# Patient Record
Sex: Female | Born: 1980 | Race: Black or African American | Hispanic: No | Marital: Single | Smoking: Current every day smoker
Health system: Southern US, Community
[De-identification: ages and names within clinical notes are randomized; demographics above are authoritative.]

## PROBLEM LIST (undated history)

## (undated) DIAGNOSIS — I10 Essential (primary) hypertension: Secondary | ICD-10-CM

## (undated) DIAGNOSIS — J449 Chronic obstructive pulmonary disease, unspecified: Secondary | ICD-10-CM

## (undated) DIAGNOSIS — J45909 Unspecified asthma, uncomplicated: Secondary | ICD-10-CM

## (undated) HISTORY — PX: OTHER SURGICAL HISTORY: SHX169

---
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
## 2001-10-29 ENCOUNTER — Emergency Department (HOSPITAL_COMMUNITY): Admission: EM | Admit: 2001-10-29 | Discharge: 2001-10-29 | Payer: Self-pay | Admitting: Emergency Medicine

## 2001-10-29 ENCOUNTER — Encounter: Payer: Self-pay | Admitting: Emergency Medicine

## 2006-06-12 ENCOUNTER — Emergency Department: Payer: Self-pay | Admitting: Emergency Medicine

## 2006-11-01 ENCOUNTER — Emergency Department: Payer: Self-pay | Admitting: Emergency Medicine

## 2011-03-11 ENCOUNTER — Emergency Department: Payer: Self-pay | Admitting: Emergency Medicine

## 2011-08-08 ENCOUNTER — Emergency Department: Payer: Self-pay | Admitting: Emergency Medicine

## 2012-02-22 LAB — HM PAP SMEAR: HM Pap smear: NEGATIVE

## 2012-12-08 ENCOUNTER — Emergency Department: Payer: Self-pay | Admitting: Emergency Medicine

## 2013-08-04 DIAGNOSIS — E669 Obesity, unspecified: Secondary | ICD-10-CM | POA: Insufficient documentation

## 2014-06-10 ENCOUNTER — Emergency Department: Payer: Self-pay | Admitting: Emergency Medicine

## 2014-06-10 LAB — COMPREHENSIVE METABOLIC PANEL
Albumin: 3.6 g/dL (ref 3.4–5.0)
Alkaline Phosphatase: 78 U/L
Anion Gap: 4 — ABNORMAL LOW (ref 7–16)
BUN: 12 mg/dL (ref 7–18)
Bilirubin,Total: 0.2 mg/dL (ref 0.2–1.0)
Calcium, Total: 9 mg/dL (ref 8.5–10.1)
Chloride: 109 mmol/L — ABNORMAL HIGH (ref 98–107)
Co2: 26 mmol/L (ref 21–32)
Creatinine: 0.9 mg/dL (ref 0.60–1.30)
EGFR (African American): >60
EGFR (Non-African Amer.): >60
Glucose: 98 mg/dL (ref 65–99)
Osmolality: 277 (ref 275–301)
Potassium: 3.4 mmol/L — ABNORMAL LOW (ref 3.5–5.1)
SGOT(AST): 11 U/L — ABNORMAL LOW (ref 15–37)
SGPT (ALT): 14 U/L
Sodium: 139 mmol/L (ref 136–145)
Total Protein: 7.5 g/dL (ref 6.4–8.2)

## 2014-06-10 LAB — CBC
HCT: 36 % (ref 35.0–47.0)
HGB: 11.2 g/dL — ABNORMAL LOW (ref 12.0–16.0)
MCH: 26.6 pg (ref 26.0–34.0)
MCHC: 31.1 g/dL — ABNORMAL LOW (ref 32.0–36.0)
MCV: 86 fL (ref 80–100)
Platelet: 302 10*3/uL (ref 150–440)
RBC: 4.21 10*6/uL (ref 3.80–5.20)
RDW: 17.3 % — ABNORMAL HIGH (ref 11.5–14.5)
WBC: 9.6 10*3/uL (ref 3.6–11.0)

## 2014-06-10 LAB — CK TOTAL AND CKMB (NOT AT ARMC)
CK, Total: 55 U/L
CK-MB: <0.5 ng/mL — ABNORMAL LOW (ref 0.5–3.6)

## 2014-06-10 LAB — TROPONIN I: Troponin-I: <0.02 ng/mL

## 2014-06-11 LAB — URINALYSIS, COMPLETE
Bacteria: NONE SEEN
Bilirubin,UR: NEGATIVE
Blood: NEGATIVE
Glucose,UR: NEGATIVE mg/dL (ref 0–75)
Ketone: NEGATIVE
Leukocyte Esterase: NEGATIVE
Nitrite: NEGATIVE
Ph: 5 (ref 4.5–8.0)
Protein: 30
RBC,UR: 1 /HPF (ref 0–5)
Specific Gravity: 1.031 (ref 1.003–1.030)
Squamous Epithelial: 5
WBC UR: 2 /HPF (ref 0–5)

## 2015-05-02 ENCOUNTER — Ambulatory Visit
Admission: RE | Admit: 2015-05-02 | Discharge: 2015-05-02 | Disposition: A | Discharge status: Home / Self Care | Payer: Disability Insurance | Source: Ambulatory Visit | Attending: Pediatrics | Admitting: Pediatrics

## 2015-05-02 ENCOUNTER — Other Ambulatory Visit: Payer: Self-pay | Admitting: Pediatrics

## 2015-05-02 DIAGNOSIS — I159 Secondary hypertension, unspecified: Secondary | ICD-10-CM

## 2015-05-02 DIAGNOSIS — J449 Chronic obstructive pulmonary disease, unspecified: Secondary | ICD-10-CM | POA: Diagnosis present

## 2015-12-05 ENCOUNTER — Emergency Department
Admission: EM | Admit: 2015-12-05 | Discharge: 2015-12-05 | Disposition: A | Discharge status: Home / Self Care | Payer: Disability Insurance | Attending: Emergency Medicine | Admitting: Emergency Medicine

## 2015-12-05 ENCOUNTER — Encounter: Payer: Self-pay | Admitting: Emergency Medicine

## 2015-12-05 DIAGNOSIS — F17210 Nicotine dependence, cigarettes, uncomplicated: Secondary | ICD-10-CM | POA: Insufficient documentation

## 2015-12-05 DIAGNOSIS — R531 Weakness: Secondary | ICD-10-CM | POA: Insufficient documentation

## 2015-12-05 LAB — CBC
HCT: 40.3 % (ref 35.0–47.0)
HEMOGLOBIN: 13.1 g/dL (ref 12.0–16.0)
MCH: 27.3 pg (ref 26.0–34.0)
MCHC: 32.6 g/dL (ref 32.0–36.0)
MCV: 83.6 fL (ref 80.0–100.0)
Platelets: 307 10*3/uL (ref 150–440)
RBC: 4.81 MIL/uL (ref 3.80–5.20)
RDW: 16.2 % — ABNORMAL HIGH (ref 11.5–14.5)
WBC: 8.8 10*3/uL (ref 3.6–11.0)

## 2015-12-05 LAB — BASIC METABOLIC PANEL
ANION GAP: 7 (ref 5–15)
BUN: 8 mg/dL (ref 6–20)
CALCIUM: 8.7 mg/dL — AB (ref 8.9–10.3)
CO2: 21 mmol/L — AB (ref 22–32)
Chloride: 108 mmol/L (ref 101–111)
Creatinine, Ser: 1 mg/dL (ref 0.44–1.00)
Glucose, Bld: 115 mg/dL — ABNORMAL HIGH (ref 65–99)
Potassium: 3.3 mmol/L — ABNORMAL LOW (ref 3.5–5.1)
Sodium: 136 mmol/L (ref 135–145)

## 2015-12-05 NOTE — ED Notes (Signed)
Patient states she went and donated plasma today and patient states "I just feel weak".  States started feeling weak 10 minutes PTA.  States has not had anything to eat today.

## 2015-12-06 ENCOUNTER — Telehealth: Payer: Self-pay | Admitting: Emergency Medicine

## 2015-12-06 NOTE — ED Notes (Signed)
Called patient due to lwot to inquire about condition and follow up plans. Explained that lab work was done here and would advise her to go to her pcp, as she still is weak, but says she is feeling some better.

## 2017-04-13 ENCOUNTER — Emergency Department
Admission: EM | Admit: 2017-04-13 | Discharge: 2017-04-13 | Disposition: A | Discharge status: Home / Self Care | Payer: Disability Insurance | Attending: Emergency Medicine | Admitting: Emergency Medicine

## 2017-04-13 ENCOUNTER — Encounter: Payer: Self-pay | Admitting: Emergency Medicine

## 2017-04-13 DIAGNOSIS — I10 Essential (primary) hypertension: Secondary | ICD-10-CM | POA: Insufficient documentation

## 2017-04-13 DIAGNOSIS — F17210 Nicotine dependence, cigarettes, uncomplicated: Secondary | ICD-10-CM | POA: Insufficient documentation

## 2017-04-13 DIAGNOSIS — K0889 Other specified disorders of teeth and supporting structures: Secondary | ICD-10-CM

## 2017-04-13 DIAGNOSIS — K029 Dental caries, unspecified: Secondary | ICD-10-CM

## 2017-04-13 HISTORY — DX: Essential (primary) hypertension: I10

## 2017-04-13 MED ORDER — LIDOCAINE VISCOUS 2 % MT SOLN
15.0000 mL | Freq: Once | OROMUCOSAL | Status: AC
  Administered 2017-04-13: 15 mL via OROMUCOSAL
  Filled 2017-04-13: qty 15

## 2017-04-13 MED ORDER — LIDOCAINE VISCOUS 2 % MT SOLN: 20.0000 mL | OROMUCOSAL | 0 refills | Status: DC | PRN

## 2017-04-13 MED ORDER — TRAMADOL HCL 50 MG PO TABS
50.0000 mg | ORAL_TABLET | Freq: Once | ORAL | Status: AC
  Administered 2017-04-13: 50 mg via ORAL
  Filled 2017-04-13: qty 1

## 2017-04-13 MED ORDER — TRAMADOL HCL 50 MG PO TABS: 50.0000 mg | ORAL_TABLET | Freq: Four times a day (QID) | ORAL | 0 refills | Status: DC | PRN

## 2017-04-13 MED ORDER — KETOROLAC TROMETHAMINE 60 MG/2ML IM SOLN
60.0000 mg | Freq: Once | INTRAMUSCULAR | Status: AC
  Administered 2017-04-13: 60 mg via INTRAMUSCULAR
  Filled 2017-04-13: qty 2

## 2017-04-13 MED ORDER — CEPHALEXIN 500 MG PO CAPS: 500.0000 mg | ORAL_CAPSULE | Freq: Four times a day (QID) | ORAL | 0 refills | Status: AC

## 2017-04-13 MED ORDER — PENICILLIN V POTASSIUM 250 MG PO TABS
500.0000 mg | ORAL_TABLET | Freq: Four times a day (QID) | ORAL | Status: DC
  Administered 2017-04-13: 500 mg via ORAL
  Filled 2017-04-13: qty 2

## 2017-04-13 NOTE — Discharge Instructions (Signed)
Please ensure that she were cleaning her teeth appropriately. Please follow-up with a dentist to have your teeth further evaluated. Please return with any fevers or worsening condition.

## 2017-04-13 NOTE — ED Triage Notes (Signed)
Pt arrives ambulatory to triage with c/o right sided dental pain x 3 days. Pt states that "I just can't take it anymore". Pt is in NAD at this time in triage.

## 2017-04-13 NOTE — ED Notes (Signed)
D/w Dr. Zenda AlpersWebster, pt's high blood pressure. No new orders at this time. Pt states her BP is always this high, but does not take any medications for it.  Informed patient the risks of continued HTN and encouraged her to follow up with [facility] which is on her dc instructions. Pt and significant other verbalized understanding  Pt denies any sxs of HTN at this time. Pt ambulatory to lobby.

## 2017-04-13 NOTE — ED Provider Notes (Signed)
[facility] Emergency Department Provider Note   ____________________________________________   First MD Initiated Contact with Patient 04/13/17 [phone]     (approximate)  I have reviewed the triage vital signs and the nursing notes.   HISTORY  Chief Complaint Dental Pain    HPI Rhonda Delgado is a 36 y.o. female who comes into the hospital today with a toothache. She reports that she's had pain for the past 3 days. She states that she can't eat, or sleep. She reports that it stridor and her crazy. The patient states that the pain is on the right side and she cannot discern which tooth hurts. The patient states that she has been taking Tylenol, BC powders Orajel and Anbesol without relief. The patient reports that she does not have a dentist. She has had pain on and off before but typically she'll just take a BC powder and it gets better. The patient reports that she does have some bad teeth on the right. She denies any fever and thinks that her face may be swollen and is unsure. She is here today for evaluation.   Past Medical History:  Diagnosis Date  . Hypertension     There are no active problems to display for this patient.   History reviewed. No pertinent surgical history.  Prior to Admission medications   Medication Sig Start Date End Date Taking? Authorizing Provider  cephALEXin (KEFLEX) 500 MG capsule Take 1 capsule (500 mg total) by mouth 4 (four) times daily. 04/13/17 04/23/17  Rebecka Apley, MD  lidocaine (XYLOCAINE) 2 % solution Use as directed 20 mLs in the mouth or throat as needed for mouth pain. 04/13/17   Rebecka Apley, MD  traMADol (ULTRAM) 50 MG tablet Take 1 tablet (50 mg total) by mouth every 6 (six) hours as needed. 04/13/17   Rebecka Apley, MD    Allergies Patient has no known allergies.  No family history on file.  Social History Social History  Substance Use Topics  . Smoking status: Current Every Day Smoker      Packs/day: 1.50    Types: Cigarettes  . Smokeless tobacco: Never Used  . Alcohol use Yes     Comment: occasionally    Review of Systems  Constitutional: No fever/chills Eyes: No visual changes. ENT: Dental pain Cardiovascular: Denies chest pain. Respiratory: Denies shortness of breath. Gastrointestinal: No abdominal pain.  No nausea, no vomiting.  No diarrhea.  No constipation. Genitourinary: Negative for dysuria. Musculoskeletal: Negative for back pain. Skin: Negative for rash. Neurological: Negative for headaches, focal weakness or numbness.   ____________________________________________   PHYSICAL EXAM:  VITAL SIGNS: ED Triage Vitals  Enc Vitals Group     BP 04/13/17 0254 (!) 185/105     Pulse Rate 04/13/17 0254 85     Resp 04/13/17 0254 18     Temp 04/13/17 0254 98.8 F (37.1 C)     Temp Source 04/13/17 0254 Oral     SpO2 04/13/17 0254 99 %     Weight 04/13/17 0255 260 lb (117.9 kg)     Height 04/13/17 0255 5\' 6"  (1.676 m)     Head Circumference --      Peak Flow --      Pain Score --      Pain Loc --      Pain Edu? --      Excl. in GC? --     Constitutional: Alert and oriented. Well appearing and in mild  distress. Eyes: Conjunctivae are normal. PERRL. EOMI. Head: Atraumatic. Nose: No congestion/rhinnorhea. Mouth/Throat: Mucous membranes are moist.  Oropharynx non-erythematous. Poor dentition. The patient has multiple rotted teeth. The patient has tenderness to palpation of her mandibular premolar first and second molars. There is extensive decay noted. No abscess noted no significant swelling noted. Cardiovascular: Normal rate, regular rhythm. Grossly normal heart sounds.  Good peripheral circulation. Respiratory: Normal respiratory effort.  No retractions. Lungs CTAB. Gastrointestinal: Soft and nontender. No distention. Positive bowel sounds Musculoskeletal: No lower extremity tenderness nor edema.   Neurologic:  Normal speech and language.  Skin:   Skin is warm, dry and intact. Marland Kitchen. Psychiatric: Mood and affect are normal.   ____________________________________________   LABS (all labs ordered are listed, but only abnormal results are displayed)  Labs Reviewed - No data to display ____________________________________________  EKG  none ____________________________________________  RADIOLOGY  No results found.  ____________________________________________   PROCEDURES  Procedure(s) performed: None  Procedures  Critical Care performed: No  ____________________________________________   INITIAL IMPRESSION / ASSESSMENT AND PLAN / ED COURSE  Pertinent labs & imaging results that were available during my care of the patient were reviewed by me and considered in my medical decision making (see chart for details).  This is a [age] year old female who comes into the hospital today with dental pain. The patient reports it has been going on for 3 days and has not gotten any better. The patient does have some extensive dental decay. The patient also had some elevated blood pressure. She reports that she has been prescribed medicine but she just doesn't take it because she doesn't want to. I will give the patient some tramadol, viscous lidocaine, Toradol and penicillin. I explained to the patient that she needs to see a dentist as her dental decay will continue to cause her pain. The patient reports that she understands. I will discharge the patient home. She has no other concerns she will be discharged home.      ____________________________________________   FINAL CLINICAL IMPRESSION(S) / ED DIAGNOSES  Final diagnoses:  Dental caries  Pain, dental      NEW MEDICATIONS STARTED DURING THIS VISIT:  New Prescriptions   CEPHALEXIN (KEFLEX) 500 MG CAPSULE    Take 1 capsule (500 mg total) by mouth 4 (four) times daily.   LIDOCAINE (XYLOCAINE) 2 % SOLUTION    Use as directed 20 mLs in the mouth or throat as needed for mouth  pain.   TRAMADOL (ULTRAM) 50 MG TABLET    Take 1 tablet (50 mg total) by mouth every 6 (six) hours as needed.     Note:  This document was prepared using Dragon voice recognition software and may include unintentional dictation errors.    Rebecka ApleyWebster, Krystall Kruckenberg P, MD 04/13/17 [phone]

## 2017-06-26 ENCOUNTER — Encounter: Payer: Self-pay | Admitting: Emergency Medicine

## 2017-06-26 ENCOUNTER — Emergency Department
Admission: EM | Admit: 2017-06-26 | Discharge: 2017-06-26 | Disposition: A | Discharge status: Home / Self Care | Payer: Disability Insurance | Attending: Emergency Medicine | Admitting: Emergency Medicine

## 2017-06-26 DIAGNOSIS — R079 Chest pain, unspecified: Secondary | ICD-10-CM | POA: Insufficient documentation

## 2017-06-26 DIAGNOSIS — Z79899 Other long term (current) drug therapy: Secondary | ICD-10-CM | POA: Insufficient documentation

## 2017-06-26 DIAGNOSIS — I10 Essential (primary) hypertension: Secondary | ICD-10-CM | POA: Insufficient documentation

## 2017-06-26 DIAGNOSIS — F17210 Nicotine dependence, cigarettes, uncomplicated: Secondary | ICD-10-CM | POA: Insufficient documentation

## 2017-06-26 DIAGNOSIS — F329 Major depressive disorder, single episode, unspecified: Secondary | ICD-10-CM | POA: Insufficient documentation

## 2017-06-26 DIAGNOSIS — F32A Depression, unspecified: Secondary | ICD-10-CM

## 2017-06-26 LAB — BASIC METABOLIC PANEL
Anion gap: 6 (ref 5–15)
BUN: 8 mg/dL (ref 6–20)
CALCIUM: 9.3 mg/dL (ref 8.9–10.3)
CO2: 23 mmol/L (ref 22–32)
CREATININE: 0.81 mg/dL (ref 0.44–1.00)
Chloride: 108 mmol/L (ref 101–111)
GFR calc Af Amer: >60 mL/min (ref >60)
GLUCOSE: 103 mg/dL — AB (ref 65–99)
POTASSIUM: 3.6 mmol/L (ref 3.5–5.1)
Sodium: 137 mmol/L (ref 135–145)

## 2017-06-26 LAB — CBC
HEMATOCRIT: 35 % (ref 35.0–47.0)
Hemoglobin: 11.3 g/dL — ABNORMAL LOW (ref 12.0–16.0)
MCH: 26.1 pg (ref 26.0–34.0)
MCHC: 32.3 g/dL (ref 32.0–36.0)
MCV: 81 fL (ref 80.0–100.0)
PLATELETS: 258 10*3/uL (ref 150–440)
RBC: 4.33 MIL/uL (ref 3.80–5.20)
RDW: 17.2 % — AB (ref 11.5–14.5)
WBC: 7.1 10*3/uL (ref 3.6–11.0)

## 2017-06-26 LAB — TROPONIN I: Troponin I: <0.03 ng/mL (ref <0.03)

## 2017-06-26 MED ORDER — LISINOPRIL-HYDROCHLOROTHIAZIDE 10-12.5 MG PO TABS: 1.0000 | ORAL_TABLET | Freq: Every day | ORAL | 2 refills | Status: DC

## 2017-06-26 MED ORDER — CITALOPRAM HYDROBROMIDE 10 MG PO TABS: 10.0000 mg | ORAL_TABLET | Freq: Every day | ORAL | 2 refills | Status: DC

## 2017-06-26 NOTE — ED Notes (Signed)
SOC    CALLED 

## 2017-06-26 NOTE — ED Triage Notes (Signed)
Pt in via POV with complaints of intermittent blurred vision x approximately 3 months, pt also reports an episode of "jitters" today.  Pt with uncontrolled hypertension, BP 228/133 upon arrival, states, "Oh that is nothing."  Pt is aware of her hx of HTN and does not take her medication because, "I ran out."  This RN explained to pt the possible consequences of extreme HTN, pt continues to play a game on her phone as I speak, states, "I am aware."  Other vitals WDL.

## 2017-06-26 NOTE — ED Notes (Signed)
SOC in progress.  

## 2017-06-26 NOTE — ED Notes (Signed)
Pt verbalized understanding od psych and ED MD instructions as well as home care and follow up. Pt in NAD at this time. Pt ambulatory at time of discharge.

## 2017-06-26 NOTE — ED Notes (Addendum)
Pt becomes tearful in triage room as I am getting her EKG, asked patient if she would like to talk about what is bothering her, pt states, "No, I will be alright."

## 2017-06-26 NOTE — ED Provider Notes (Signed)
[facility] Emergency Department Provider Note  Time seen: 7:18 PM  I have reviewed the triage vital signs and the nursing notes.   HISTORY  Chief Complaint Blurred Vision and Hypertension    HPI Claretta KJERSTEN ORMISTON is a 36 y.o. female With a past medical history of hypertension off medications, who presents to the emergency department for intermittent blurred vision intermittent chest pain and high blood pressure. According to the patient she has been off her blood pressure medications for approximately one year that she does not have insurance and has not seen a doctor. Blood pressure upon arrival 228 which she states is fairly normal for her. Patient states over the past several months she's been having intermittent blurred vision and intermittent chest pain. Denies any currently. Denies any shortness of breath nausea or diaphoresis. patient tearful during exam, saying she just going through a lot and has been depressed.  Past Medical History:  Diagnosis Date  . Hypertension     There are no active problems to display for this patient.   No past surgical history on file.  Prior to Admission medications   Medication Sig Start Date End Date Taking? Authorizing Provider  lidocaine (XYLOCAINE) 2 % solution Use as directed 20 mLs in the mouth or throat as needed for mouth pain. 04/13/17   Rebecka Apley, MD  traMADol (ULTRAM) 50 MG tablet Take 1 tablet (50 mg total) by mouth every 6 (six) hours as needed. 04/13/17   Rebecka Apley, MD    No Known Allergies  No family history on file.  Social History Social History  Substance Use Topics  . Smoking status: Current Every Day Smoker    Packs/day: 1.50    Types: Cigarettes  . Smokeless tobacco: Never Used  . Alcohol use Yes     Comment: occasionally    Review of Systems Constitutional: Negative for fever. Cardiovascular: Negative for chest pain. Respiratory: Negative for shortness of  breath. Gastrointestinal: Negative for abdominal pain Musculoskeletal: Negative for back pain. Neurological: Negative for headache All other ROS negative  ____________________________________________   PHYSICAL EXAM:  VITAL SIGNS: ED Triage Vitals  Enc Vitals Group     BP 06/26/17 1733 (!) 228/133     Pulse Rate 06/26/17 1733 87     Resp 06/26/17 1733 16     Temp 06/26/17 1733 98.9 F (37.2 C)     Temp Source 06/26/17 1733 Oral     SpO2 06/26/17 1733 98 %     Weight 06/26/17 1734 260 lb (117.9 kg)     Height 06/26/17 1734  (1.676 m)     Head Circumference --      Peak Flow --      Pain Score 06/26/17 1733 2     Pain Loc --      Pain Edu? --      Excl. in GC? --     Constitutional: Alert and oriented. Well appearing and in no distress. Eyes: Normal exam ENT   Head: Normocephalic and atraumatic.   Mouth/Throat: Mucous membranes are moist. Cardiovascular: Normal rate, regular rhythm. 2/6 systolic murmur Respiratory: Normal respiratory effort without tachypnea nor retractions. Breath sounds are clear  Gastrointestinal: Soft and nontender. No distention.  Musculoskeletal: Nontender with normal range of motion in all extremities. No lower extremity tenderness or edema. Neurologic:  Normal speech and language. No gross focal neurologic deficits Skin:  Skin is warm, dry and intact.  Psychiatric: tearful during exam.  ____________________________________________  EKG  EKG reviewed and interpreted by myself shows normal sinus rhythm at 91 bpm, narrow QRS, left axis deviation, normal intervals nonspecific ST changes. No ST elevation.  ____________________________________________   INITIAL IMPRESSION / ASSESSMENT AND PLAN / ED COURSE  Pertinent labs & imaging results that were available during my care of the patient were reviewed by me and considered in my medical decision making (see chart for details).  patient presents to the emergency department for 3  months of intermittent blurred vision and intermittent chest discomfort. Differential this time includes ACS, anxiety, hypertension, among others. Patient's labs are within normal limits including her kidney function. Troponin is negative. EKG is reassuring. However during exam the patient became tearful saying that she just going through a lot and has been under a lot of stress. States she has a strong person but has been having worsening depression. Does not have insurance so there is no one that she can talk to. Denies any active SI or HI, but when offered the patient states she would like to speak to a psychiatrist. As far as the patient's medical workup at a long discussion with her regarding her blood pressure and the need to control it with medication. Patient states she was on HCTZ before which she believes helped some but did not help enough. We will place the patient on HCTZ/Thiazide, which is on the $4 list. Patient agreeable to this.  psychiatric consult pending. Patient does not meet IVC criteria, does not appear to be a threat to herself or anybody else.  psychiatrist this evening the patient. Agrees that the patient does not meet IVC criteria. We will refer to [facility] for further follow-up. We'll start the patient on Celexa 10 mg daily.  ____________________________________________   FINAL CLINICAL IMPRESSION(S) / ED DIAGNOSES  depression Hypertension    Minna Antis, MD 06/26/17 2231

## 2017-06-26 NOTE — ED Notes (Signed)
SOC still in progress.

## 2017-06-26 NOTE — ED Notes (Signed)
SOC machine set up in pt room. Pt sitting on side of bed.

## 2017-09-26 ENCOUNTER — Encounter: Payer: Self-pay | Admitting: Emergency Medicine

## 2017-09-26 ENCOUNTER — Emergency Department
Admission: EM | Admit: 2017-09-26 | Discharge: 2017-09-26 | Disposition: A | Discharge status: Home / Self Care | Payer: Disability Insurance | Attending: Emergency Medicine | Admitting: Emergency Medicine

## 2017-09-26 DIAGNOSIS — N63 Unspecified lump in unspecified breast: Secondary | ICD-10-CM

## 2017-09-26 DIAGNOSIS — I10 Essential (primary) hypertension: Secondary | ICD-10-CM | POA: Insufficient documentation

## 2017-09-26 DIAGNOSIS — L02213 Cutaneous abscess of chest wall: Secondary | ICD-10-CM | POA: Insufficient documentation

## 2017-09-26 DIAGNOSIS — F17210 Nicotine dependence, cigarettes, uncomplicated: Secondary | ICD-10-CM | POA: Insufficient documentation

## 2017-09-26 LAB — COMPREHENSIVE METABOLIC PANEL
ALK PHOS: 76 U/L (ref 38–126)
ALT: 14 U/L (ref 14–54)
AST: 19 U/L (ref 15–41)
Albumin: 4 g/dL (ref 3.5–5.0)
Anion gap: 3 — ABNORMAL LOW (ref 5–15)
BUN: 9 mg/dL (ref 6–20)
CALCIUM: 8.8 mg/dL — AB (ref 8.9–10.3)
CHLORIDE: 107 mmol/L (ref 101–111)
CO2: 26 mmol/L (ref 22–32)
CREATININE: 0.77 mg/dL (ref 0.44–1.00)
GFR calc non Af Amer: >60 mL/min (ref >60)
Glucose, Bld: 102 mg/dL — ABNORMAL HIGH (ref 65–99)
Potassium: 3.3 mmol/L — ABNORMAL LOW (ref 3.5–5.1)
SODIUM: 136 mmol/L (ref 135–145)
Total Bilirubin: 0.3 mg/dL (ref 0.3–1.2)
Total Protein: 7.5 g/dL (ref 6.5–8.1)

## 2017-09-26 LAB — CBC
HCT: 33.5 % — ABNORMAL LOW (ref 35.0–47.0)
HEMOGLOBIN: 10.3 g/dL — AB (ref 12.0–16.0)
MCH: 24.1 pg — AB (ref 26.0–34.0)
MCHC: 30.8 g/dL — ABNORMAL LOW (ref 32.0–36.0)
MCV: 78.3 fL — AB (ref 80.0–100.0)
PLATELETS: 349 10*3/uL (ref 150–440)
RBC: 4.28 MIL/uL (ref 3.80–5.20)
RDW: 16.9 % — ABNORMAL HIGH (ref 11.5–14.5)
WBC: 7.6 10*3/uL (ref 3.6–11.0)

## 2017-09-26 LAB — TROPONIN I: Troponin I: <0.03 ng/mL (ref <0.03)

## 2017-09-26 MED ORDER — LOSARTAN POTASSIUM 50 MG PO TABS: 50.0000 mg | ORAL_TABLET | Freq: Every day | ORAL | 0 refills | Status: DC

## 2017-09-26 MED ORDER — HYDROCHLOROTHIAZIDE 25 MG PO TABS
25.0000 mg | ORAL_TABLET | Freq: Every day | ORAL | Status: DC
  Administered 2017-09-26: 25 mg via ORAL
  Filled 2017-09-26: qty 1

## 2017-09-26 MED ORDER — LISINOPRIL 10 MG PO TABS
10.0000 mg | ORAL_TABLET | Freq: Once | ORAL | Status: AC
  Administered 2017-09-26: 10 mg via ORAL
  Filled 2017-09-26: qty 1

## 2017-09-26 NOTE — ED Triage Notes (Signed)
Pt comes into the ED via POV c/o abscess on the lefts ide of her chest wall.  Patient denies any fevers at home.  Patient in NAD at this time.

## 2017-09-26 NOTE — ED Provider Notes (Signed)
[facility] Emergency Department Provider Note  ____________________________________________   First MD Initiated Contact with Patient 09/26/17 1536     (approximate)  I have reviewed the triage vital signs and the nursing notes.   HISTORY  Chief Complaint Abscess    HPI Rhonda Delgado is a 36 y.o. female Rhonda Delgado is a 36 y.o. female is here today because she had some left-sided chest pain last night and noticed a knot in the chest, she also had some numbness that went to the left arm and the left side of her face felt numb, she has had elevated blood pressure for a while, her blood pressure was so high they would not do a Pap smear at her regular doctors and so she just quit taking her medication because "obviously it was not working "; she does not complain of chest pain at this time, she does say her head feels a little funny, she denies any known slurred speech, she does have a family history of hypertension, her aunts on her father's side have breast cancer as well she was concerned about the lump; patient is a smoker and does drink caffeine    Past Medical History:  Diagnosis Date  . Hypertension     There are no active problems to display for this patient.   History reviewed. No pertinent surgical history.  Prior to Admission medications   Medication Sig Start Date End Date Taking? Authorizing Provider  citalopram (CELEXA) 10 MG tablet Take 1 tablet (10 mg total) by mouth daily. 06/26/17 06/26/18  Minna Antis, MD  losartan (COZAAR) 50 MG tablet Take 1 tablet (50 mg total) by mouth daily. 09/26/17   Faythe Ghee, PA-C    Allergies Patient has no known allergies.  No family history on file.  Social History Social History   Tobacco Use  . Smoking status: Current Every Day Smoker    Packs/day: 1.50    Types: Cigarettes  . Smokeless tobacco: Never Used  Substance Use Topics  . Alcohol use: Yes    Comment:  occasionally  . Drug use: No    Review of Systems  Constitutional: No fever/chills Eyes: No visual changes. ENT: No sore throat. Respiratory: Denies cough CARDIAC: Positive for left-sided chest pain that radiated to the left arm last night Breast: Positive for nodule Genitourinary: Negative for dysuria. Musculoskeletal: Negative for back pain. Skin: Negative for rash.    ____________________________________________   PHYSICAL EXAM:  VITAL SIGNS: ED Triage Vitals  Enc Vitals Group     BP 09/26/17 1523 (!) 220/100     Pulse Rate 09/26/17 1523 94     Resp 09/26/17 1523 17     Temp 09/26/17 1523 98.7 F (37.1 C)     Temp Source 09/26/17 1523 Oral     SpO2 09/26/17 1523 100 %     Weight 09/26/17 1520 263 lb (119.3 kg)     Height 09/26/17 1520 5\' 6"  (1.676 m)     Head Circumference --      Peak Flow --      Pain Score 09/26/17 1520 1     Pain Loc --      Pain Edu? --      Excl. in GC? --     Constitutional: Alert and oriented. Well appearing and in no acute distress.  Appears well Eyes: Conjunctivae are normal.  Head: Atraumatic. Nose: No congestion/rhinnorhea. Mouth/Throat: Mucous membranes are moist.   Cardiovascular: Normal rate, regular  rhythm.  Heart sounds are normal Respiratory: Normal respiratory effort.  No retractions, lungs are clear to auscultation BREAST: A small firm smooth Allman size nodules noted in the left upper breast, there is no dimpling noted, no rash noted GU: deferred Musculoskeletal: FROM all extremities, warm and well perfused Neurologic:  Normal speech and language.  Skin:  Skin is warm, dry and intact. No rash noted. Psychiatric: Mood and affect are normal. Speech and behavior are normal.  ____________________________________________   LABS (all labs ordered are listed, but only abnormal results are displayed)  Labs Reviewed  CBC - Abnormal; Notable for the following components:      Result Value   Hemoglobin 10.3 (*)    HCT  33.5 (*)    MCV 78.3 (*)    MCH 24.1 (*)    MCHC 30.8 (*)    RDW 16.9 (*)    All other components within normal limits  COMPREHENSIVE METABOLIC PANEL - Abnormal; Notable for the following components:   Potassium 3.3 (*)    Glucose, Bld 102 (*)    Calcium 8.8 (*)    Anion gap 3 (*)    All other components within normal limits  TROPONIN I   ____________________________________________   ____________________________________________  RADIOLOGY    ____________________________________________   PROCEDURES  Procedure(s) performed: saline lock, ekg, labs      ____________________________________________   INITIAL IMPRESSION / ASSESSMENT AND PLAN / ED COURSE  Pertinent labs & imaging results that were available during my care of the patient were reviewed by me and considered in my medical decision making (see chart for details).  Patient is 36 year old female that presents to the ED complaining of a knot in the left chest, she noticed this last night when she was having pain in her left chest and started to rub the area, at the same time she had numbness shooting into the left arm and up the left side of her face, she has a history of hypertension, she quit taking her blood pressure medicine as it was not working, she states her GYN doctor denied her Pap smear due to the blood pressure being too high, she was not given other medication to take, she states she is not having any chest pain at this time, on physical exam she appears well blood pressure is 220/131, there is a small almond shaped nodule in the left upper breast, the nurse talked to the charge nurse and Dr. Cyril LoosenKinner, there is no room for her in the major side at this time, the labs and EKG will be started in this area, at this time she appears stable     ----------------------------------------- 5:20 PM on 09/26/2017 -----------------------------------------  Patient's EKG, labs were basically normal, her troponin was  normal, her potassium is a little low at 3.3, her hemoglobin and hematocrit are low but she just finished her period and states she has heavy periods,  patient was started on losartan 50 mg daily for her blood pressure, she is to recheck her blood pressure in 1-2 weeks, if it is not decreased to near 150/90 she is to double up on the pills to make 100 mg daily, [facility], they will help her get a mammogram at a discounted rate, but they can also reassess her blood pressure problems, the patient states she understands and will call them for an evaluation and appointment, she was discharged in stable condition   ____________________________________________  FINAL CLINICAL IMPRESSION(S) / ED DIAGNOSES  Final diagnoses:  Breast nodule  Essential hypertension      NEW MEDICATIONS STARTED DURING THIS VISIT:  This SmartLink is deprecated. Use AVSMEDLIST instead to display the medication list for a patient.   Note:  This document was prepared using Dragon voice recognition software and may include unintentional dictation errors.     Faythe GheeFisher, Jeane Cashatt W, PA-C 09/26/17 1722    Minna AntisPaduchowski, Kevin, MD 09/26/17 2258

## 2017-09-26 NOTE — Discharge Instructions (Signed)
[facility], call for an appointment, they would be able to help you get a mammogram at a discounted rate,  cut back on the caffeine and see if those nodules starts to decrease in size, take the new blood pressure medication as prescribed, if you notice in 2 weeks the blood pressure has not decreased then you will need to double up on the pills to make 100 mg, [facility] can also address your blood pressure medication, stop by any fire station or EMS base and they would be able to check your blood pressure for you, I would like for your numbers to be down below 150/90

## 2017-10-07 ENCOUNTER — Encounter: Payer: Self-pay | Admitting: Intensive Care

## 2017-10-07 ENCOUNTER — Emergency Department: Payer: Self-pay

## 2017-10-07 ENCOUNTER — Emergency Department
Admission: EM | Admit: 2017-10-07 | Discharge: 2017-10-07 | Disposition: A | Discharge status: Home / Self Care | Payer: Self-pay | Attending: Emergency Medicine | Admitting: Emergency Medicine

## 2017-10-07 DIAGNOSIS — I10 Essential (primary) hypertension: Secondary | ICD-10-CM | POA: Insufficient documentation

## 2017-10-07 DIAGNOSIS — F17210 Nicotine dependence, cigarettes, uncomplicated: Secondary | ICD-10-CM | POA: Insufficient documentation

## 2017-10-07 DIAGNOSIS — D649 Anemia, unspecified: Secondary | ICD-10-CM | POA: Insufficient documentation

## 2017-10-07 DIAGNOSIS — R519 Headache, unspecified: Secondary | ICD-10-CM

## 2017-10-07 DIAGNOSIS — R51 Headache: Secondary | ICD-10-CM

## 2017-10-07 LAB — URINALYSIS, COMPLETE (UACMP) WITH MICROSCOPIC
BILIRUBIN URINE: NEGATIVE
Glucose, UA: NEGATIVE mg/dL
Hgb urine dipstick: NEGATIVE
KETONES UR: NEGATIVE mg/dL
Leukocytes, UA: NEGATIVE
Nitrite: NEGATIVE
PH: 6 (ref 5.0–8.0)
PROTEIN: NEGATIVE mg/dL
Specific Gravity, Urine: 1.005 (ref 1.005–1.030)

## 2017-10-07 LAB — BASIC METABOLIC PANEL
Anion gap: 6 (ref 5–15)
BUN: 8 mg/dL (ref 6–20)
CO2: 22 mmol/L (ref 22–32)
Calcium: 9.1 mg/dL (ref 8.9–10.3)
Chloride: 107 mmol/L (ref 101–111)
Creatinine, Ser: 0.72 mg/dL (ref 0.44–1.00)
GFR calc Af Amer: >60 mL/min (ref >60)
GFR calc non Af Amer: >60 mL/min (ref >60)
GLUCOSE: 100 mg/dL — AB (ref 65–99)
POTASSIUM: 4 mmol/L (ref 3.5–5.1)
Sodium: 135 mmol/L (ref 135–145)

## 2017-10-07 LAB — CBC
HEMATOCRIT: 32.9 % — AB (ref 35.0–47.0)
Hemoglobin: 10.3 g/dL — ABNORMAL LOW (ref 12.0–16.0)
MCH: 24.6 pg — AB (ref 26.0–34.0)
MCHC: 31.2 g/dL — ABNORMAL LOW (ref 32.0–36.0)
MCV: 78.7 fL — AB (ref 80.0–100.0)
Platelets: 300 10*3/uL (ref 150–440)
RBC: 4.18 MIL/uL (ref 3.80–5.20)
RDW: 17.5 % — AB (ref 11.5–14.5)
WBC: 10.1 10*3/uL (ref 3.6–11.0)

## 2017-10-07 LAB — POCT PREGNANCY, URINE: PREG TEST UR: NEGATIVE

## 2017-10-07 MED ORDER — LABETALOL HCL 5 MG/ML IV SOLN
10.0000 mg | Freq: Once | INTRAVENOUS | Status: AC
  Administered 2017-10-07: 10 mg via INTRAVENOUS
  Filled 2017-10-07: qty 4

## 2017-10-07 MED ORDER — METOPROLOL TARTRATE 50 MG PO TABS
50.0000 mg | ORAL_TABLET | Freq: Once | ORAL | Status: AC
  Administered 2017-10-07: 50 mg via ORAL
  Filled 2017-10-07: qty 1

## 2017-10-07 MED ORDER — ONDANSETRON HCL 4 MG/2ML IJ SOLN
4.0000 mg | Freq: Once | INTRAMUSCULAR | Status: AC
  Administered 2017-10-07: 4 mg via INTRAVENOUS
  Filled 2017-10-07: qty 2

## 2017-10-07 MED ORDER — CLONIDINE HCL 0.1 MG PO TABS
ORAL_TABLET | ORAL | Status: AC
  Filled 2017-10-07: qty 2

## 2017-10-07 MED ORDER — CLONIDINE HCL 0.1 MG PO TABS
0.2000 mg | ORAL_TABLET | Freq: Once | ORAL | Status: AC
  Administered 2017-10-07: 0.2 mg via ORAL

## 2017-10-07 MED ORDER — METOPROLOL TARTRATE 25 MG PO TABS: 25.0000 mg | ORAL_TABLET | Freq: Two times a day (BID) | ORAL | 0 refills | Status: DC

## 2017-10-07 MED ORDER — ACETAMINOPHEN 500 MG PO TABS
1000.0000 mg | ORAL_TABLET | Freq: Once | ORAL | Status: AC
  Administered 2017-10-07: 1000 mg via ORAL
  Filled 2017-10-07: qty 2

## 2017-10-07 MED ORDER — METOCLOPRAMIDE HCL 5 MG/ML IJ SOLN
10.0000 mg | Freq: Once | INTRAMUSCULAR | Status: AC
  Administered 2017-10-07: 10 mg via INTRAVENOUS

## 2017-10-07 MED ORDER — KETOROLAC TROMETHAMINE 30 MG/ML IJ SOLN
30.0000 mg | Freq: Once | INTRAMUSCULAR | Status: AC
  Administered 2017-10-07: 30 mg via INTRAVENOUS
  Filled 2017-10-07: qty 1

## 2017-10-07 MED ORDER — FERROUS SULFATE 325 (65 FE) MG PO TABS: 325.0000 mg | ORAL_TABLET | Freq: Every day | ORAL | 0 refills | Status: DC

## 2017-10-07 MED ORDER — METOCLOPRAMIDE HCL 5 MG/ML IJ SOLN
INTRAMUSCULAR | Status: AC
  Filled 2017-10-07: qty 2

## 2017-10-07 NOTE — ED Provider Notes (Signed)
[facility] Emergency Department Provider Note  ____________________________________________  Time seen: Approximately 3:58 PM  I have reviewed the triage vital signs and the nursing notes.   HISTORY  Chief Complaint Headache and Hypertension    HPI Rhonda Delgado is a 37 y.o. female recently evaluated for headache in the setting of hypertension presenting with headache.  The patient was seen here 09/26/17 for chest pain, and was found to be hypertensive and discharged on losartan 50 mg daily.  She reports that for the past several months, she has had intermittent headaches, which generally improve after taking her blood pressure medication.  On Saturday, she had a particularly stressful day at work, came home and went to sleep, and woke up at 2 AM with a pressure sensation behind the left eye and a progressively worsening headache over the next 3 days.  She has had some nausea without vomiting, as well as photosensitivity and photosensitivity.  She describes some blurry vision in the left eye; no diplopia.  No neck pain or stiffness, tick bites, fever or chills, numbness tingling or weakness, changes in speech or mental status, or difficulty walking.  The patient has tried Tylenol 1 time without improvement.  FH: No family history of aneurysms.   Past Medical History:  Diagnosis Date  . Hypertension     There are no active problems to display for this patient.   History reviewed. No pertinent surgical history.  Current Outpatient Rx  . Order #: [id number] Class: Print  . Order #: [id number] Class: Print  . Order #: [id number] Class: Print  . Order #: [id number] Class: Print    Allergies Patient has no known allergies.  History reviewed. No pertinent family history.  Social History Social History   Tobacco Use  . Smoking status: Current Every Day Smoker    Packs/day: 1.50    Types: Cigarettes  . Smokeless tobacco: Never Used  Substance Use Topics  .  Alcohol use: Yes    Comment: occasionally  . Drug use: No    Review of Systems Constitutional: No fever/chills.  No lightheadedness or syncope.  No trauma. Eyes: Blurry vision in the left eye, intermittent.  No diplopia. ENT: No sore throat. No congestion or rhinorrhea.  No neck pain.  No pain over the temples Cardiovascular: Denies chest pain. Denies palpitations. Respiratory: Denies shortness of breath.  No cough. Gastrointestinal: No abdominal pain.  Positive nausea, no vomiting.  No diarrhea.  No constipation. Genitourinary: Negative for dysuria. Musculoskeletal: Negative for back pain. Skin: Negative for rash. Neurological: Positive for headaches. No focal numbness, tingling or weakness.  Positive blurry left vision.  No diplopia.  No changes in speech or mental status.  No difficulty walking.      ____________________________________________   PHYSICAL EXAM:  VITAL SIGNS: ED Triage Vitals  Enc Vitals Group     BP 10/07/17 1442 (!) 210/135     Pulse Rate 10/07/17 1442 85     Resp 10/07/17 1442 16     Temp 10/07/17 1442 98.1 F (36.7 C)     Temp Source 10/07/17 1442 Oral     SpO2 10/07/17 1442 100 %     Weight 10/07/17 1444 263 lb (119.3 kg)     Height 10/07/17 1444 5\' 6"  (1.676 m)     Head Circumference --      Peak Flow --      Pain Score 10/07/17 1449 10     Pain Loc --      Pain Edu? --  Excl. in GC? --     Constitutional: Alert and oriented.  Mildly uncomfortable appearing but in no acute distress. Answers questions appropriately. Eyes: Conjunctivae are normal.  EOMI. PERRLA.  No scleral icterus.  No eye discharge.  No vertical or horizontal nystagmus.  Mild photosensitivity. Head: Atraumatic.  No pain with palpation over the temples bilaterally. Nose: No congestion/rhinnorhea. Mouth/Throat: Mucous membranes are moist.  Neck: No stridor.  Supple.  No JVD.  No meningismus. Cardiovascular: Normal rate, regular rhythm. No murmurs, rubs or gallops.   Respiratory: Normal respiratory effort.  No accessory muscle use or retractions. Lungs CTAB.  No wheezes, rales or ronchi. Gastrointestinal: Obese.  Soft, nontender and nondistended.  No guarding or rebound.  No peritoneal signs. Musculoskeletal: No LE edema.  Neurologic:  A&Ox3.  Speech is clear.  Face and smile are symmetric.  EOMI. PERRLA.  No vertical or horizontal nystagmus.  Moves all extremities well. Skin:  Skin is warm, dry and intact. No rash noted. Psychiatric: Mood and affect are normal. Speech and behavior are normal.  Normal judgement.  ____________________________________________   LABS (all labs ordered are listed, but only abnormal results are displayed)  Labs Reviewed  CBC - Abnormal; Notable for the following components:      Result Value   Hemoglobin 10.3 (*)    HCT 32.9 (*)    MCV 78.7 (*)    MCH 24.6 (*)    MCHC 31.2 (*)    RDW 17.5 (*)    All other components within normal limits  BASIC METABOLIC PANEL - Abnormal; Notable for the following components:   Glucose, Bld 100 (*)    All other components within normal limits  URINALYSIS, COMPLETE (UACMP) WITH MICROSCOPIC - Abnormal; Notable for the following components:   Color, Urine STRAW (*)    APPearance CLEAR (*)    Bacteria, UA RARE (*)    Squamous Epithelial / LPF 0-5 (*)    All other components within normal limits  POCT PREGNANCY, URINE  POC URINE PREG, ED   ____________________________________________  EKG  Not indicated. ____________________________________________  RADIOLOGY  Ct Head Wo Contrast  Result Date: 10/07/2017 CLINICAL DATA:  Worsening headache. EXAM: CT HEAD WITHOUT CONTRAST TECHNIQUE: Contiguous axial images were obtained from the base of the skull through the vertex without intravenous contrast. COMPARISON:  06/11/2014 FINDINGS: Brain: No evidence of acute infarction, hemorrhage, hydrocephalus, extra-axial collection or mass lesion/mass effect. Vascular: No hyperdense vessel or  unexpected calcification. Skull: Normal. Negative for fracture or focal lesion. Sinuses/Orbits: Mild polypoid mucosal thickening of the ethmoid sinuses and maxillary sinuses. Other: None. IMPRESSION: No acute intracranial abnormality. Mild sinusitis. Electronically Signed   By: Ted Mcalpineobrinka  Dimitrova M.D.   On: 10/07/2017 17:41    ____________________________________________   PROCEDURES  Procedure(s) performed: None  Procedures  Critical Care performed: No ____________________________________________   INITIAL IMPRESSION / ASSESSMENT AND PLAN / ED COURSE  Pertinent labs & imaging results that were available during my care of the patient were reviewed by me and considered in my medical decision making (see chart for details).  37 y.o. F with a history of hypertension and headaches associated with hypertension presenting with headache for the past 2 days.  The patient does have some associated photo and phono sensitivity as well as left blurry vision that is intermittent.  The patient denies thunderclap headache, and says her headache has been progressive.  Intracranial bleed is less likely than hypertensive headache or migraine, will get a CT scan for further evaluation.  I  have discussed lumbar puncture with the patient, who prefers to continue with symptomatic treatment and imaging prior to undergoing LP.  I do not see any evidence of meningitis today.  On my examination, the patient's blood pressure has improved to 187/121, and I will plan to reevaluate her after symptomatic treatment and her diagnostic workup has been complete.  Plan reevaluation for final disposition.  I have reviewed the patient's medical chart.  ----------------------------------------- 5:07 PM on 10/07/2017 -----------------------------------------  At this time, the patient states her headache has significantly improved and almost completely resolved.  She is no longer nauseated, and her photosensitivity has also  improved.  Her blood pressure is 150/102, and I will give her an oral anti-hypertensive medication for more sustained improvement.  Plan reevaluation for final disposition.  ----------------------------------------- 5:31 PM on 10/07/2017 -----------------------------------------  The patient has a chronic anemia which is unchanged from December but is new over the last several years.  I will start her on ferrous sulfate, and have her follow-up with her primary care physician for further anemia workup.  In addition, I will add metoprolol as an adjunct to her losartan, and have her record daily blood pressures that she can bring with her for follow-up.  ----------------------------------------- 5:55 PM on 10/07/2017 -----------------------------------------  The patient's headache has almost completely resolved, and her blood pressure has significantly improved.  Her CT scan does not show any acute intracranial process.  I have talked to the patient about her discharge instructions, medication instructions, follow-up and return precautions.  At this time the patient is safe for discharge.    ____________________________________________  FINAL CLINICAL IMPRESSION(S) / ED DIAGNOSES  Final diagnoses:  Anemia, unspecified type  Hypertension, unspecified type  Acute nonintractable headache, unspecified headache type         NEW MEDICATIONS STARTED DURING THIS VISIT:  This SmartLink is deprecated. Use AVSMEDLIST instead to display the medication list for a patient.    Rockne Menghini, MD 10/07/17 1756

## 2017-10-07 NOTE — ED Triage Notes (Signed)
Patient started experiencing a headache Saturday and has progressively gotten worse. Hypertensive in triage. Light and noise sensitivity. Hx HTN

## 2017-10-07 NOTE — ED Notes (Signed)
Pt presents with headache and high blood pressure. States she has recently been prescribed meds for htn, but has not been taking it very long. Pt reports photosensitivity and blurry vision with headache. She reports that she has had a headache in the past that goes away after taking bp meds but that this time it is more intense and has lasted longer. Pt alert & oriented with NAD noted.

## 2017-10-07 NOTE — Discharge Instructions (Signed)
Please continue to take the losartan which was prescribed for you, and add metoprolol.  Please take your blood pressure once daily and record the value so you can bring it with you to your follow-up appointment.  Please also start taking an iron supplement pill, and talk to your primary care physician about your low blood counts (anemia).  [facility] to establish a primary care physician.  Return to the emergency department if you develop severe pain, nausea or vomiting, changes in vision, speech or mental status, numbness tingling or weakness, chest pain, or any other symptoms concerning to you.

## 2017-10-07 NOTE — ED Notes (Signed)
Pt discharged home after verbalizing understanding of discharge instructions; nad noted. 

## 2017-11-08 ENCOUNTER — Emergency Department
Admission: EM | Admit: 2017-11-08 | Discharge: 2017-11-08 | Disposition: A | Discharge status: Home / Self Care | Payer: Self-pay | Attending: Emergency Medicine | Admitting: Emergency Medicine

## 2017-11-08 ENCOUNTER — Other Ambulatory Visit: Payer: Self-pay

## 2017-11-08 DIAGNOSIS — Z79899 Other long term (current) drug therapy: Secondary | ICD-10-CM | POA: Insufficient documentation

## 2017-11-08 DIAGNOSIS — I10 Essential (primary) hypertension: Secondary | ICD-10-CM | POA: Insufficient documentation

## 2017-11-08 DIAGNOSIS — F17210 Nicotine dependence, cigarettes, uncomplicated: Secondary | ICD-10-CM | POA: Insufficient documentation

## 2017-11-08 MED ORDER — CLONIDINE HCL 0.1 MG PO TABS
0.1000 mg | ORAL_TABLET | Freq: Once | ORAL | Status: AC
  Administered 2017-11-08: 0.1 mg via ORAL

## 2017-11-08 MED ORDER — CLONIDINE HCL 0.1 MG PO TABS
ORAL_TABLET | ORAL | Status: AC
  Filled 2017-11-08: qty 1

## 2017-11-08 MED ORDER — CLONIDINE HCL 0.1 MG PO TABS: 0.1000 mg | ORAL_TABLET | Freq: Two times a day (BID) | ORAL | 0 refills | Status: DC | PRN

## 2017-11-08 NOTE — ED Notes (Signed)
This RN in room and informed pt that Dr. Shaune PollackLord has ordered another blood pressure medication since her blood pressure is not coming down. Pt states "how long is this going to take, I have a client at 1000 and I need to leave". Pt informed that I will have MD come in and talk to her but that we are trying to get her blood pressure under control. Pt states that her blood pressure is not coming down because "I'm impatient and I need to go".   Dr. Shaune PollackLord informed and will go speak with patient.

## 2017-11-08 NOTE — ED Notes (Signed)
Per Dr. Shaune PollackLord, pt had her Losartan 50 mg in the room with her. Pt was instructed to take 1 tablet of her medication by Dr. Shaune PollackLord.   Pt took Metoprolol this morning at home around 0530

## 2017-11-08 NOTE — ED Provider Notes (Signed)
[facility] Emergency Department Provider Note ____________________________________________   I have reviewed the triage vital signs and the triage nursing note.  HISTORY  Chief Complaint Eye Drainage   Historian Patient  HPI Rhonda Delgado is a 37 y.o. female presents to the ED with complaint of "eyes running.  "She does not her primary care doctor.  She was diagnosed with high blood pressure in the emergency department in December and started on losartan 50 mg daily.  She was seen again in January for headache and found to have significantly elevated blood pressure and was recommended to add metoprolol 25 mg twice daily.  Patient states that she has not been taking losartan, she actually had just switched to just the metoprolol.  She states that she has not really been having any headaches.  She is not really having any vision problems.  No blurry vision or double vision or loss of vision.  She states that occasionally she will have right eye right sided black spots, none currently.  Her complaint today is related to several days of teary eyes, especially waking up in the morning feeling like her eyes were reddish or crusty.  Denies other upper respiratory congestion, or coughing.  Denies fevers or nausea.  Denies eye pain or headache.  Reports no prior history with allergy symptoms.  He has been using "dollar store "eyedrops, which she thinks are a lubricant.   Past Medical History:  Diagnosis Date  . Hypertension     There are no active problems to display for this patient.   No past surgical history on file.  Prior to Admission medications   Medication Sig Start Date End Date Taking? Authorizing Provider  citalopram (CELEXA) 10 MG tablet Take 1 tablet (10 mg total) by mouth daily. 06/26/17 06/26/18  Minna AntisPaduchowski, Kevin, MD  cloNIDine (CATAPRES) 0.1 MG tablet Take 1 tablet (0.1 mg total) by mouth 2 (two) times daily as needed (Top blood pressure  (systolic) greater than 220, or bottom blood pressure (diastolic) greater than 120). 11/08/17 11/08/18  Governor RooksLord, Carlyne Keehan, MD  ferrous sulfate 325 (65 FE) MG tablet Take 1 tablet (325 mg total) by mouth daily. 10/07/17 10/07/18  Rockne MenghiniNorman, Anne-Caroline, MD  losartan (COZAAR) 50 MG tablet Take 1 tablet (50 mg total) by mouth daily. 09/26/17   Fisher, Roselyn BeringSusan W, PA-C  metoprolol tartrate (LOPRESSOR) 25 MG tablet Take 1 tablet (25 mg total) by mouth 2 (two) times daily. 10/07/17 10/07/18  Rockne MenghiniNorman, Anne-Caroline, MD    No Known Allergies  No family history on file.  Social History Social History   Tobacco Use  . Smoking status: Current Every Day Smoker    Packs/day: 1.50    Types: Cigarettes  . Smokeless tobacco: Never Used  Substance Use Topics  . Alcohol use: Yes    Comment: occasionally  . Drug use: No    Review of Systems  Constitutional: Negative for fever. Eyes: Occasional red eyes when she wakes up in the morning, none currently.  Other eye complaints as per HPI. ENT: Negative for sore throat. Cardiovascular: Negative for chest pain. Respiratory: Negative for shortness of breath. Gastrointestinal: Negative for abdominal pain, vomiting and diarrhea. Genitourinary: Negative for dysuria. Musculoskeletal: Negative for back pain. Skin: Negative for rash. Neurological: Negative for headache.  ____________________________________________   PHYSICAL EXAM:  VITAL SIGNS: ED Triage Vitals  Enc Vitals Group     BP 11/08/17 0722 (!) 214/132     Pulse Rate 11/08/17 0722 78     Resp  11/08/17 0722 20     Temp 11/08/17 0722 99.1 F (37.3 C)     Temp Source 11/08/17 0722 Oral     SpO2 11/08/17 0722 100 %     Weight 11/08/17 0720 263 lb (119.3 kg)     Height --      Head Circumference --      Peak Flow --      Pain Score --      Pain Loc --      Pain Edu? --      Excl. in GC? --      Constitutional: Alert and oriented. Well appearing and in no distress. HEENT   Head: Normocephalic and  atraumatic.      Eyes: Conjunctivae are normal. Pupils equal and round.  Funduscopic exam normal bilaterally.      Ears:         Nose: No congestion/rhinnorhea.   Mouth/Throat: Mucous membranes are moist.   Neck: No stridor. Cardiovascular/Chest: Normal rate, regular rhythm.  No murmurs, rubs, or gallops. Respiratory: Normal respiratory effort without tachypnea nor retractions. Breath sounds are clear and equal bilaterally. No wheezes/rales/rhonchi. Gastrointestinal: Soft. No distention, no guarding, no rebound. Nontender.    Genitourinary/rectal:Deferred Musculoskeletal: Nontender with normal range of motion in all extremities. No joint effusions.  No lower extremity tenderness.  No edema. Neurologic:  Normal speech and language. No gross or focal neurologic deficits are appreciated. Skin:  Skin is warm, dry and intact. No rash noted. Psychiatric: Mood and affect are normal. Speech and behavior are normal. Patient exhibits appropriate insight and judgment.   ____________________________________________  LABS (pertinent positives/negatives) I, Governor Rooks, MD the attending physician have reviewed the labs noted below.  Labs Reviewed - No data to display  ____________________________________________  RADIOLOGY All Xrays were viewed by me.  Imaging interpreted by Radiologist, and I, Governor Rooks, MD the attending physician have reviewed the radiologist interpretation noted below.  None __________________________________________  PROCEDURES  Procedure(s) performed: None  Critical Care performed: None   ____________________________________________  ED COURSE / ASSESSMENT AND PLAN  Pertinent labs & imaging results that were available during my care of the patient were reviewed by me and considered in my medical decision making (see chart for details).    Patient asymptomatic from a blood pressure standpoint, she has had previously elevated blood pressures similarly.   It sounds like she did not understand when she was added blood pressure medication metoprolol to the losartan and has only been taking metoprolol.  She was given the losartan dose here in the emergency department.  From the hydrated standpoint, there is no eye drainage now, no eye findings.  I am most suspicious of allergic versus some sort of viral syndrome although she is totally well-appearing now.  She is not complaining of any vision issues from the standpoint of hypertension.  Patient's blood pressure did remain elevated significantly here although she is asymptomatic.  Initially we had discussed likely discharge home, however of diastolic remained significantly elevated at 135-145, and I did order clonidine.  I was recommending at this point some additional evaluation including laboratory studies and EKG and further monitoring of blood pressure and possible even observation admission for hypertensive urgency.  Patient did not want to stay and wanted to go home.  She had a client appointment she needs to get to.  She felt like she was additionally stressed by being here.  We had a long discussion regarding my recognition for her to stay given severely elevated  blood pressures and although it is asymptomatic, these are severely elevated.  Patient is alert and oriented and capable of making her own decisions.  She understands risk of worsening condition including stroke or heart attack or death.  I am going to provide additional prescription for the clonidine for as needed.  She can get a blood pressure cuff and check blood pressures at home.  She understands return precautions.  DIFFERENTIAL DIAGNOSIS: Including but not limited to pinkeye, bacterial conjunctivitis, seasonal allergies, orbital or periorbital cellulitis, hypertensive urgency or emergency, glaucoma, etc.  CONSULTATIONS:   None   Patient / Family / Caregiver informed of clinical course, medical decision-making process, and agree  with plan.   I discussed return precautions, follow-up instructions, and discharge instructions with patient and/or family.  Discharge Instructions : You are evaluated for eye drainage, unknown is noted today.  As we discussed your exam and evaluation are overall reassuring today in the department, and I am most suspicious of allergies or viral illness causing a several days of eye drainage in the morning.  Return to the emergency room immediately for any red eyes, blurry vision, double vision, loss of vision, headache, fever, or any other symptoms concerning to you.  Your blood pressure was found to be significantly elevated today, and as we discussed you need to go ahead and take both blood pressure medications, the losartan 50 mg once daily, as well as the metoprolol 25 mg twice daily.  You do need to see a primary care doctor for blood pressure follow-up in about 1-2 weeks.  Multiple offices are provided to make a follow-up appointment.  You are prescribed an additional blood pressure medicine to take as needed for elevated blood pressures  - clonidine.  You were recommended to further work up (blood work and ECG), treatment of blood pressure and monitoring in the ER vs. Observation admission in the hospital today for elevated blood pressure, but you chose to go home.  We discussed risk of worsening condition including stroke, heart attack, and death.   Return to emerge department immediately for any new or uncontrolled headache, nausea, sweats, chest pain, weakness, numbness, confusion or altered mental status, vision changes, or any other symptoms concerning to you.     ___________________________________________   FINAL CLINICAL IMPRESSION(S) / ED DIAGNOSES   Final diagnoses:  Essential hypertension      ___________________________________________        Note: This dictation was prepared with Dragon dictation. Any transcriptional errors that result from this process are  unintentional    Governor Rooks, MD 11/08/17 [phone]

## 2017-11-08 NOTE — ED Triage Notes (Signed)
Pt with bilat eye drainage and itching.

## 2017-11-08 NOTE — Discharge Instructions (Addendum)
You are evaluated for eye drainage, unknown is noted today.  As we discussed your exam and evaluation are overall reassuring today in the department, and I am most suspicious of allergies or viral illness causing a several days of eye drainage in the morning.  Return to the emergency room immediately for any red eyes, blurry vision, double vision, loss of vision, headache, fever, or any other symptoms concerning to you.  Your blood pressure was found to be significantly elevated today, and as we discussed you need to go ahead and take both blood pressure medications, the losartan 50 mg once daily, as well as the metoprolol 25 mg twice daily.  You do need to see a primary care doctor for blood pressure follow-up in about 1-2 weeks.  Multiple offices are provided to make a follow-up appointment.  You are prescribed an additional blood pressure medicine to take as needed for elevated blood pressures  - clonidine.  You were recommended to further work up (blood work and ECG), treatment of blood pressure and monitoring in the ER vs. Observation admission in the hospital today for elevated blood pressure, but you chose to go home.  We discussed risk of worsening condition including stroke, heart attack, and death.   Return to emerge department immediately for any new or uncontrolled headache, nausea, sweats, chest pain, weakness, numbness, confusion or altered mental status, vision changes, or any other symptoms concerning to you.

## 2017-11-08 NOTE — ED Triage Notes (Signed)
Pt states she took her bp meds this am. Pt with high blood pressure in triage.

## 2018-01-08 ENCOUNTER — Other Ambulatory Visit: Payer: Self-pay

## 2018-01-08 ENCOUNTER — Emergency Department
Admission: EM | Admit: 2018-01-08 | Discharge: 2018-01-08 | Disposition: A | Discharge status: Home / Self Care | Payer: Self-pay | Attending: Emergency Medicine | Admitting: Emergency Medicine

## 2018-01-08 ENCOUNTER — Emergency Department: Payer: Self-pay

## 2018-01-08 ENCOUNTER — Encounter: Payer: Self-pay | Admitting: Emergency Medicine

## 2018-01-08 DIAGNOSIS — Z5321 Procedure and treatment not carried out due to patient leaving prior to being seen by health care provider: Secondary | ICD-10-CM | POA: Insufficient documentation

## 2018-01-08 DIAGNOSIS — R079 Chest pain, unspecified: Secondary | ICD-10-CM | POA: Insufficient documentation

## 2018-01-08 HISTORY — DX: Chronic obstructive pulmonary disease, unspecified: J44.9

## 2018-01-08 HISTORY — DX: Unspecified asthma, uncomplicated: J45.909

## 2018-01-08 LAB — CBC
HEMATOCRIT: 35.5 % (ref 35.0–47.0)
Hemoglobin: 11.7 g/dL — ABNORMAL LOW (ref 12.0–16.0)
MCH: 28.4 pg (ref 26.0–34.0)
MCHC: 32.9 g/dL (ref 32.0–36.0)
MCV: 86.2 fL (ref 80.0–100.0)
Platelets: 253 10*3/uL (ref 150–440)
RBC: 4.11 MIL/uL (ref 3.80–5.20)
RDW: 18 % — AB (ref 11.5–14.5)
WBC: 8.4 10*3/uL (ref 3.6–11.0)

## 2018-01-08 LAB — BASIC METABOLIC PANEL
ANION GAP: 6 (ref 5–15)
BUN: 8 mg/dL (ref 6–20)
CO2: 24 mmol/L (ref 22–32)
Calcium: 9 mg/dL (ref 8.9–10.3)
Chloride: 105 mmol/L (ref 101–111)
Creatinine, Ser: 0.78 mg/dL (ref 0.44–1.00)
GFR calc Af Amer: >60 mL/min (ref >60)
GLUCOSE: 86 mg/dL (ref 65–99)
POTASSIUM: 3.5 mmol/L (ref 3.5–5.1)
Sodium: 135 mmol/L (ref 135–145)

## 2018-01-08 LAB — TROPONIN I: Troponin I: <0.03 ng/mL (ref <0.03)

## 2018-01-08 NOTE — ED Triage Notes (Addendum)
Pt to ED via POV with c/o CP with radiation down RT arm that started about 30 min prior to ED arrival. Pt in NAD at this time. Pt hypertensive in triage 223/137, states last took BP meds x2wks ago because meds are " running low".

## 2018-01-11 ENCOUNTER — Telehealth: Payer: Self-pay | Admitting: Emergency Medicine

## 2018-01-11 NOTE — Telephone Encounter (Signed)
Called patient due to lwot to inquire about condition and follow up plans. Patient does not have pcp or insurance.  I encouraged [facility] to [facility] so that she can become a patient to control her blood pressure.  Advised that reading was very high here and that it puts her at risk for stroke and heart attack.  I also advised her to return if any symptoms.  She is currently not having chest pain.

## 2018-03-17 ENCOUNTER — Emergency Department
Admission: EM | Admit: 2018-03-17 | Discharge: 2018-03-17 | Disposition: A | Discharge status: Home / Self Care | Payer: Self-pay | Attending: Emergency Medicine | Admitting: Emergency Medicine

## 2018-03-17 ENCOUNTER — Encounter: Payer: Self-pay | Admitting: Emergency Medicine

## 2018-03-17 DIAGNOSIS — J449 Chronic obstructive pulmonary disease, unspecified: Secondary | ICD-10-CM | POA: Insufficient documentation

## 2018-03-17 DIAGNOSIS — Z79899 Other long term (current) drug therapy: Secondary | ICD-10-CM | POA: Insufficient documentation

## 2018-03-17 DIAGNOSIS — F17210 Nicotine dependence, cigarettes, uncomplicated: Secondary | ICD-10-CM | POA: Insufficient documentation

## 2018-03-17 DIAGNOSIS — I10 Essential (primary) hypertension: Secondary | ICD-10-CM | POA: Insufficient documentation

## 2018-03-17 DIAGNOSIS — K029 Dental caries, unspecified: Secondary | ICD-10-CM | POA: Insufficient documentation

## 2018-03-17 MED ORDER — AMOXICILLIN 500 MG PO CAPS: 500.0000 mg | ORAL_CAPSULE | Freq: Three times a day (TID) | ORAL | 0 refills | Status: DC

## 2018-03-17 MED ORDER — OXYCODONE-ACETAMINOPHEN 5-325 MG PO TABS
1.0000 | ORAL_TABLET | Freq: Once | ORAL | Status: AC
  Administered 2018-03-17: 1 via ORAL
  Filled 2018-03-17: qty 1

## 2018-03-17 MED ORDER — LIDOCAINE VISCOUS HCL 2 % MT SOLN: 10.0000 mL | OROMUCOSAL | 0 refills | Status: DC | PRN

## 2018-03-17 MED ORDER — AMLODIPINE BESYLATE 5 MG PO TABS: 5.0000 mg | ORAL_TABLET | Freq: Every day | ORAL | 3 refills | Status: DC

## 2018-03-17 MED ORDER — HYDROCHLOROTHIAZIDE 25 MG PO TABS: 25.0000 mg | ORAL_TABLET | Freq: Every day | ORAL | 3 refills | Status: DC

## 2018-03-17 NOTE — Discharge Instructions (Addendum)
Your blood pressure was found to be significantly elevated today. I am going to start you on amlodipine and HCTZ for blood pressure.  You do need to see a primary care doctor for blood pressure follow-up in about 1-2 weeks.  Multiple offices are provided to make a follow-up appointment.   You were recommended to further work up (blood work and ECG), treatment of blood pressure and monitoring in the ER vs. Observation admission in the hospital today for elevated blood pressure, but you chose to go home.  We discussed risk of worsening condition including stroke, heart attack, and death.   Return to emerge department immediately for any new or uncontrolled headache, nausea, sweats, chest pain, weakness, numbness, confusion or altered mental status, vision changes, or any other symptoms concerning to you.   OPTIONS FOR DENTAL FOLLOW UP CARE  [place] Department of Health and Human Services - Local Safety Net Dental Clinics [email or web address].gov/dph/oralhealth/services/[email or web address].htm   [facility] [phone])  [facility] [phone])  [facility] [phone])  [facility] [phone]  [facility] [phone]) This clinic caters to the indigent population and is on a lottery system. Location: [employer] [address], [address], [place] [facility] Hours: Wednesdays from 6pm - 9pm, patients seen by a lottery system. For dates, call or go to [email or web address] Services: Cleanings, fillings and simple extractions. Payment Options: DENTAL WORK IS FREE OF CHARGE. Bring proof of income or support. Best way to get seen: Arrive at 5:15 pm - this is a lottery, NOT first come/first serve, so arriving earlier will not increase your chances of being seen.     [facility] [phone] Select option 1 for emergencies   Location: [facility], [address], [address], [place] [facility] Hours: No walk-ins accepted - call the day before to schedule an appointment. Check in times are 9:30 am and 1:30 pm. Services: Simple extractions, temporary fillings, pulpectomy/pulp debridement, uncomplicated abscess drainage. Payment Options: PAYMENT IS DUE AT THE TIME OF SERVICE.  Fee is usually $100-200, additional surgical procedures (e.g. abscess drainage) may be extra. Cash, checks, Visa/MasterCard accepted.  Can file Medicaid if patient is covered for dental - patient should call case worker to check. No discount for [facility] patients. Best way to get seen: MUST call the day before and get onto the schedule. Can usually be seen the next 1-2 days. No walk-ins accepted.     [facility] [phone]   Location: [facility], [address], [place] [facility] Hours: M, W, Th, F 8am or 1:30pm, Tues 9a or 1:30 - first come/first served. Services: Simple extractions, temporary fillings, uncomplicated abscess drainage.  You do not need [place] resident. Payment Options: PAYMENT IS DUE AT THE TIME OF SERVICE. Dental insurance, otherwise sliding scale - bring proof of income or support. Depending on income and treatment needed, cost is usually $50-200. Best way to get seen: Arrive early as it is first come/first served.     [facility] [phone]   Location: [address] Clinic Hours: Mon-Thu 8a-5p Services: Most basic dental services including extractions and fillings. Payment Options: PAYMENT IS DUE AT THE TIME OF SERVICE. Sliding scale, up to 50% off - bring proof if income or support. Medicaid with dental option accepted. Best way to get seen: Call to schedule an appointment, can usually be seen within 2 weeks  OR they will try to see walk-ins - show up at 8a or 2p (you may have to wait).      [facility] [phone] [place] RESIDENTS ONLY   Location: [facility], [address], [place], [place] [address] Clinic Hours: By appointment only. Monday - Thursday 8am-5pm, Friday 8am-12pm Services: Cleanings, fillings, extractions. Payment Options: PAYMENT IS DUE AT THE TIME OF SERVICE. Cash, Visa or MasterCard. Sliding scale - $30 minimum per service. Best way to get seen: Come in to office, complete packet and make an appointment - need proof of income or support monies for each household member and proof of [facility] residence. Usually takes about a month to get in.     [facility] [phone]   Location: [address], [facility] Hours: Walk-in Urgent Care Dental Services are offered Monday-Friday mornings only. The numbers of emergencies accepted daily is limited to the number of providers available. Maximum 15 - Mondays, Wednesdays & Thursdays Maximum 10 - Tuesdays & Fridays Services: You do not need to be a [facility] resident to be seen for a dental emergency. Emergencies are defined as pain, swelling, abnormal bleeding, or dental trauma. Walkins will receive x-rays if needed. NOTE: Dental cleaning is not an emergency. Payment Options: PAYMENT IS DUE AT THE TIME OF SERVICE. Minimum co-pay is $40.00 for uninsured patients. Minimum co-pay is $3.00 for Medicaid with dental coverage. Dental Insurance is accepted and must be presented at time of visit. Medicare does not cover dental. Forms of payment: Cash, credit card, checks. Best way to get seen: If not previously registered with the clinic, walk-in dental registration begins at 7:15 am and is on a first come/first serve basis. If previously registered with the clinic, call to make an appointment.     [facility] [phone] [place] RESIDENTS ONLY   Location: [address] [place], [place] Clinic  Hours: Mon-Thu 10a-2p Services: Extractions only! Payment Options: FREE (donations accepted) - bring proof of income or support Best way to get seen: Call and schedule an appointment OR come at 8am on the 1st Monday of every month (except for holidays) when it is first come/first served.     [facility] [phone]   Location: [address] [place], [place] Clinic Hours: Friday mornings Services, Payment Options, Best way to get seen: Call for info

## 2018-03-17 NOTE — ED Triage Notes (Signed)
Patient has pain to right side of mouth.  Patient has known broken tooth to area.  Patient reports pain x 3-4 days.  Patient reports lack of relief with NSAIDS and oragel.

## 2018-03-17 NOTE — ED Notes (Signed)
See triage note  States she has dental pain   Pain is mainly on the right   States this tooth has been broken for about 18 years

## 2018-03-17 NOTE — ED Provider Notes (Signed)
[facility] Emergency Department Provider Note  ____________________________________________  Time seen: Approximately 3:54 PM  I have reviewed the triage vital signs and the nursing notes.   HISTORY  Chief Complaint Dental Pain    HPI Rhonda Delgado is a 37 y.o. female that presents to the emergency department for evaluation of right bottom dental pain for 3 days. She has used oragel for pain. She does not have a dentist and does not recall last dental visit.  Patient states that she has a history of high blood pressure.  She has been on several medications prescribed by the emergency department in the past and does not remember what medicine she was previously taking.  She has not had blood pressure medications for several months.  She does not have a primary care doctor and does not have plans to follow-up with primary care.  She denies fever, chills, headache, visual changes, facial swelling, chest pain, shortness of breath.  Past Medical History:  Diagnosis Date  . Asthma   . COPD (chronic obstructive pulmonary disease) (HCC)   . Hypertension     There are no active problems to display for this patient.   History reviewed. No pertinent surgical history.  Prior to Admission medications   Medication Sig Start Date End Date Taking? Authorizing Provider  amLODipine (NORVASC) 5 MG tablet Take 1 tablet (5 mg total) by mouth daily. 03/17/18 03/17/19  Enid Derry, PA-C  amoxicillin (AMOXIL) 500 MG capsule Take 1 capsule (500 mg total) by mouth 3 (three) times daily. 03/17/18   Enid Derry, PA-C  citalopram (CELEXA) 10 MG tablet Take 1 tablet (10 mg total) by mouth daily. 06/26/17 06/26/18  Minna Antis, MD  cloNIDine (CATAPRES) 0.1 MG tablet Take 1 tablet (0.1 mg total) by mouth 2 (two) times daily as needed (Top blood pressure (systolic) greater than 220, or bottom blood pressure (diastolic) greater than 120). 11/08/17 11/08/18  Governor Rooks, MD  ferrous  sulfate 325 (65 FE) MG tablet Take 1 tablet (325 mg total) by mouth daily. 10/07/17 10/07/18  Rockne Menghini, MD  hydrochlorothiazide (HYDRODIURIL) 25 MG tablet Take 1 tablet (25 mg total) by mouth daily. 03/17/18   Enid Derry, PA-C  lidocaine (XYLOCAINE) 2 % solution Use as directed 10 mLs in the mouth or throat as needed for mouth pain. 03/17/18   Enid Derry, PA-C  losartan (COZAAR) 50 MG tablet Take 1 tablet (50 mg total) by mouth daily. 09/26/17   Fisher, Roselyn Bering, PA-C  metoprolol tartrate (LOPRESSOR) 25 MG tablet Take 1 tablet (25 mg total) by mouth 2 (two) times daily. 10/07/17 10/07/18  Rockne Menghini, MD    Allergies Patient has no known allergies.  No family history on file.  Social History Social History   Tobacco Use  . Smoking status: Current Every Day Smoker    Packs/day: 1.50    Types: Cigarettes  . Smokeless tobacco: Never Used  Substance Use Topics  . Alcohol use: Yes    Comment: occasionally  . Drug use: No     Review of Systems  Constitutional: No fever/chills Cardiovascular: No chest pain. Respiratory: No SOB. Gastrointestinal:  No nausea, no vomiting.  Musculoskeletal: Negative for musculoskeletal pain. Skin: Negative for rash, abrasions, lacerations, ecchymosis. Neurological: Negative for headaches, numbness or tingling   ____________________________________________   PHYSICAL EXAM:  VITAL SIGNS: ED Triage Vitals  Enc Vitals Group     BP 03/17/18 1534 (!) 238/143     Pulse Rate 03/17/18 1534 96  Resp 03/17/18 1534 18     Temp 03/17/18 1534 98.9 F (37.2 C)     Temp Source 03/17/18 1534 Oral     SpO2 03/17/18 1534 99 %     Weight 03/17/18 1536 260 lb (117.9 kg)     Height 03/17/18 1536 5\' 6"  (1.676 m)     Head Circumference --      Peak Flow --      Pain Score 03/17/18 1536 8     Pain Loc --      Pain Edu? --      Excl. in GC? --      Constitutional: Alert and oriented. Well appearing and in no acute distress. Eyes:  Conjunctivae are normal. PERRL. EOMI. Head: Atraumatic. ENT:      Ears:      Nose: No congestion/rhinnorhea.       Mouth/Throat: Mucous membranes are moist.  Back bottom right molar decayed to gumline with surrounding tenderness to palpation.  No visible swelling.  No palpable mass. Neck: No stridor.  Cardiovascular: Good peripheral circulation. Respiratory:  Good air entry to the bases with no decreased or absent breath sounds. Gastrointestinal: Bowel sounds 4 quadrants. Soft and nontender to palpation. No guarding or rigidity. No palpable masses. No distention. No  Musculoskeletal: Full range of motion to all extremities. No gross deformities appreciated. Neurologic:  Normal speech and language. No gross focal neurologic deficits are appreciated.  Skin:  Skin is warm, dry and intact. No rash noted. Psychiatric: Mood and affect are normal. Speech and behavior are normal. Patient exhibits appropriate insight and judgement.   ____________________________________________   LABS (all labs ordered are listed, but only abnormal results are displayed)  Labs Reviewed - No data to display ____________________________________________  EKG   ____________________________________________  RADIOLOGY   No results found.  ____________________________________________    PROCEDURES  Procedure(s) performed:    Procedures    Medications  oxyCODONE-acetaminophen (PERCOCET/ROXICET) 5-325 MG per tablet 1 tablet (1 tablet Oral Given 03/17/18 1633)     ____________________________________________   INITIAL IMPRESSION / ASSESSMENT AND PLAN / ED COURSE  Pertinent labs & imaging results that were available during my care of the patient were reviewed by me and considered in my medical decision making (see chart for details).  Review of the [place] CSRS was performed in accordance of the [facility] prior to dispensing any controlled drugs.     Patient's diagnosis is consistent with dental  abscess and essential hypertension. Exam is reassuring. Blood pressures are consistent with previous blood pressures in emergency department dating back to 2018.  Regarding high blood pressure, patient is asymptomatic and refuses any blood work, imaging, EKG in ED. Dr. Don Perking was consulted and recommended patient be placed on 5mg  amlodipine, 25mg  HCTZ with 3 refills. Referrals for dentistry and primary care were given. Extensive patient education and risks of high blood pressure were given.  Patient will be discharged home with prescriptions for amoxicillin, viscous lidocaine, amlodipine, HCTZ. Patient is to follow up with dentist and PCP as directed. Patient is given ED precautions to return to the ED for any worsening or new symptoms.     ____________________________________________  FINAL CLINICAL IMPRESSION(S) / ED DIAGNOSES  Final diagnoses:  Essential hypertension  Dental caries      NEW MEDICATIONS STARTED DURING THIS VISIT:  ED Discharge Orders        Ordered    amoxicillin (AMOXIL) 500 MG capsule  3 times daily     03/17/18 1630  lidocaine (XYLOCAINE) 2 % solution  As needed     03/17/18 1630    amLODipine (NORVASC) 5 MG tablet  Daily     03/17/18 1632    hydrochlorothiazide (HYDRODIURIL) 25 MG tablet  Daily     03/17/18 1632          This chart was dictated using voice recognition software/Dragon. Despite best efforts to proofread, errors can occur which can change the meaning. Any change was purely unintentional.    Enid DerryWagner, Dawud Mays, PA-C 03/18/18 Earlyne [id number]    [place], [place], MD 03/21/18 [phone]

## 2018-03-19 ENCOUNTER — Encounter: Payer: Self-pay | Admitting: Emergency Medicine

## 2018-03-19 ENCOUNTER — Emergency Department
Admission: EM | Admit: 2018-03-19 | Discharge: 2018-03-19 | Disposition: A | Discharge status: Home / Self Care | Payer: Medicaid Other | Attending: Emergency Medicine | Admitting: Emergency Medicine

## 2018-03-19 ENCOUNTER — Other Ambulatory Visit: Payer: Self-pay

## 2018-03-19 DIAGNOSIS — J449 Chronic obstructive pulmonary disease, unspecified: Secondary | ICD-10-CM | POA: Insufficient documentation

## 2018-03-19 DIAGNOSIS — I10 Essential (primary) hypertension: Secondary | ICD-10-CM

## 2018-03-19 DIAGNOSIS — Z79899 Other long term (current) drug therapy: Secondary | ICD-10-CM | POA: Insufficient documentation

## 2018-03-19 DIAGNOSIS — R51 Headache: Secondary | ICD-10-CM | POA: Insufficient documentation

## 2018-03-19 DIAGNOSIS — R519 Headache, unspecified: Secondary | ICD-10-CM

## 2018-03-19 DIAGNOSIS — F17210 Nicotine dependence, cigarettes, uncomplicated: Secondary | ICD-10-CM | POA: Insufficient documentation

## 2018-03-19 MED ORDER — CLONIDINE HCL 0.1 MG PO TABS
0.1000 mg | ORAL_TABLET | Freq: Once | ORAL | Status: AC
  Administered 2018-03-19: 0.1 mg via ORAL

## 2018-03-19 MED ORDER — CLONIDINE HCL 0.1 MG PO TABS
ORAL_TABLET | ORAL | Status: AC
  Filled 2018-03-19: qty 1

## 2018-03-19 MED ORDER — KETOROLAC TROMETHAMINE 30 MG/ML IJ SOLN
30.0000 mg | Freq: Once | INTRAMUSCULAR | Status: AC
  Administered 2018-03-19: 30 mg via INTRAVENOUS

## 2018-03-19 MED ORDER — KETOROLAC TROMETHAMINE 30 MG/ML IJ SOLN
INTRAMUSCULAR | Status: AC
  Filled 2018-03-19: qty 1

## 2018-03-19 NOTE — ED Provider Notes (Signed)
[facility] Emergency Department Provider Note    First MD Initiated Contact with Patient 03/19/18 [phone]     (approximate)  I have reviewed the triage vital signs and the nursing notes.   HISTORY  Chief Complaint Headache   HPI Rhonda Delgado is a 37 y.o. female with below list of chronic medical conditions including hypertension presents to the emergency department generalized headache since yesterday.  Patient states current pain score is 8 out of 10.  Patient denies any weakness numbness visual changes or gait instability.  Patient denies any nausea or vomiting.  [facility] Monday at which point she was markedly hypertensive and prescribed amlodipine and HCTZ which she states that she has been taking.   Past Medical History:  Diagnosis Date  . Asthma   . COPD (chronic obstructive pulmonary disease) (HCC)   . Hypertension     There are no active problems to display for this patient.   Past surgical history Noncontributory  Prior to Admission medications   Medication Sig Start Date End Date Taking? Authorizing Provider  amLODipine (NORVASC) 5 MG tablet Take 1 tablet (5 mg total) by mouth daily. 03/17/18 03/17/19 Yes Enid DerryWagner, Ashley, PA-C  amoxicillin (AMOXIL) 500 MG capsule Take 1 capsule (500 mg total) by mouth 3 (three) times daily. 03/17/18  Yes Enid DerryWagner, Ashley, PA-C  hydrochlorothiazide (HYDRODIURIL) 25 MG tablet Take 1 tablet (25 mg total) by mouth daily. 03/17/18  Yes Enid DerryWagner, Ashley, PA-C  lidocaine (XYLOCAINE) 2 % solution Use as directed 10 mLs in the mouth or throat as needed for mouth pain. 03/17/18  Yes Enid DerryWagner, Ashley, PA-C  citalopram (CELEXA) 10 MG tablet Take 1 tablet (10 mg total) by mouth daily. Patient not taking: Reported on 03/19/2018 06/26/17 06/26/18  Minna AntisPaduchowski, Kevin, MD  cloNIDine (CATAPRES) 0.1 MG tablet Take 1 tablet (0.1 mg total) by mouth 2 (two) times daily as needed (Top blood  pressure (systolic) greater than 220, or bottom blood pressure (diastolic) greater than 120). Patient not taking: Reported on 03/19/2018 11/08/17 11/08/18  Governor RooksLord, Rebecca, MD  ferrous sulfate 325 (65 FE) MG tablet Take 1 tablet (325 mg total) by mouth daily. Patient not taking: Reported on 03/19/2018 10/07/17 10/07/18  Rockne MenghiniNorman, Anne-Caroline, MD  losartan (COZAAR) 50 MG tablet Take 1 tablet (50 mg total) by mouth daily. Patient not taking: Reported on 03/19/2018 09/26/17   Faythe GheeFisher, Susan W, PA-C  metoprolol tartrate (LOPRESSOR) 25 MG tablet Take 1 tablet (25 mg total) by mouth 2 (two) times daily. Patient not taking: Reported on 03/19/2018 10/07/17 10/07/18  Rockne MenghiniNorman, Anne-Caroline, MD    Allergies No known drug allergies No family history on file.  Social History Social History   Tobacco Use  . Smoking status: Current Every Day Smoker    Packs/day: 1.50    Types: Cigarettes  . Smokeless tobacco: Never Used  Substance Use Topics  . Alcohol use: Yes    Comment: occasionally  . Drug use: No    Review of Systems Constitutional: No fever/chills Eyes: No visual changes. ENT: No sore throat. Cardiovascular: Denies chest pain. Respiratory: Denies shortness of breath. Gastrointestinal: No abdominal pain.  No nausea, no vomiting.  No diarrhea.  No constipation. Genitourinary: Negative for dysuria. Musculoskeletal: Negative for neck pain.  Negative for back pain. Integumentary: Negative for rash. Neurological: Negative for headaches, focal weakness or numbness.   ____________________________________________   PHYSICAL EXAM:  VITAL SIGNS: ED Triage Vitals  Enc Vitals Group  BP 03/19/18 0445 (!) 199/140     Pulse Rate 03/19/18 0445 93     Resp 03/19/18 0445 18     Temp 03/19/18 0445 97.8 F (36.6 C)     Temp Source 03/19/18 0445 Oral     SpO2 03/19/18 0445 99 %     Weight 03/19/18 0446 117.9 kg (260 lb)     Height 03/19/18 0446 1.676 m (5\' 6" )     Head Circumference --      Peak Flow --       Pain Score 03/19/18 0446 10     Pain Loc --      Pain Edu? --      Excl. in GC? --     Constitutional: Alert and oriented. Well appearing and in no acute distress. Eyes: Conjunctivae are normal. PERRL. EOMI. Head: Atraumatic. Mouth/Throat: Mucous membranes are moist.  Oropharynx non-erythematous. Neck: No stridor.   Cardiovascular: Normal rate, regular rhythm. Good peripheral circulation. Grossly normal heart sounds. Respiratory: Normal respiratory effort.  No retractions. Lungs CTAB. Gastrointestinal: Soft and nontender. No distention.  Musculoskeletal: No lower extremity tenderness nor edema. No gross deformities of extremities. Neurologic:  Normal speech and language. No gross focal neurologic deficits are appreciated.  Skin:  Skin is warm, dry and intact. No rash noted. Psychiatric: Mood and affect are normal. Speech and behavior are normal.         Procedures   ____________________________________________   INITIAL IMPRESSION / ASSESSMENT AND PLAN / ED COURSE  As part of my medical decision making, I reviewed the following data within the electronic MEDICAL RECORD NUMBER   37 year old female presented emergency department above-stated history and physical exam of headache and noted to be markedly hypertensive on arrival.  Patient given 30 mg of IV Toradol with resolution of headache.  Patient's blood pressure improved to 183/117 at which point patient was given clonidine 0.1 mg.  I spoke with the patient at length regarding the dangers of high blood pressure and necessity of treating it appropriately. ____________________________________________  FINAL CLINICAL IMPRESSION(S) / ED DIAGNOSES  Final diagnoses:  Essential hypertension  Acute nonintractable headache, unspecified headache type     MEDICATIONS GIVEN DURING THIS VISIT:  Medications  cloNIDine (CATAPRES) tablet 0.1 mg (has no administration in time range)  ketorolac (TORADOL) 30 MG/ML injection 30 mg (30  mg Intravenous Given 03/19/18 0513)     ED Discharge Orders    None       Note:  This document was prepared using Dragon voice recognition software and may include unintentional dictation errors.    Darci Current, MD 03/19/18 [phone]

## 2018-03-19 NOTE — ED Triage Notes (Signed)
Patient ambulatory to triage with steady gait, without difficulty or distress noted; pt reports generalized HA since yesterday; denies hx of same; pt reports being seen here Monday and rx BP med

## 2018-03-19 NOTE — ED Notes (Signed)
Pt to the er for headache. Pt was seen here on Monday afternoon for a toothache and it was discovered she had HTN. Pt placed on HCTZ. Pt took a dose on Tuesday at 1000. Pt reports the headache started at 1300 on Tuesday.

## 2018-04-14 ENCOUNTER — Other Ambulatory Visit: Payer: Self-pay

## 2018-04-14 ENCOUNTER — Emergency Department
Admission: EM | Admit: 2018-04-14 | Discharge: 2018-04-14 | Disposition: A | Discharge status: Home / Self Care | Payer: Self-pay | Attending: Emergency Medicine | Admitting: Emergency Medicine

## 2018-04-14 DIAGNOSIS — K0889 Other specified disorders of teeth and supporting structures: Secondary | ICD-10-CM | POA: Insufficient documentation

## 2018-04-14 DIAGNOSIS — Z5321 Procedure and treatment not carried out due to patient leaving prior to being seen by health care provider: Secondary | ICD-10-CM | POA: Insufficient documentation

## 2018-04-14 NOTE — ED Triage Notes (Signed)
Patient reports having dental pain for 4 days.  Reports right side, but unsure if upper or lower.

## 2018-04-17 ENCOUNTER — Emergency Department
Admission: EM | Admit: 2018-04-17 | Discharge: 2018-04-17 | Disposition: A | Discharge status: Home / Self Care | Payer: Self-pay | Attending: Student in an Organized Health Care Education/Training Program | Admitting: Student in an Organized Health Care Education/Training Program

## 2018-04-17 ENCOUNTER — Encounter: Payer: Self-pay | Admitting: Emergency Medicine

## 2018-04-17 ENCOUNTER — Other Ambulatory Visit: Payer: Self-pay

## 2018-04-17 DIAGNOSIS — I10 Essential (primary) hypertension: Secondary | ICD-10-CM | POA: Insufficient documentation

## 2018-04-17 DIAGNOSIS — K0889 Other specified disorders of teeth and supporting structures: Secondary | ICD-10-CM | POA: Insufficient documentation

## 2018-04-17 DIAGNOSIS — J449 Chronic obstructive pulmonary disease, unspecified: Secondary | ICD-10-CM | POA: Insufficient documentation

## 2018-04-17 DIAGNOSIS — Z79899 Other long term (current) drug therapy: Secondary | ICD-10-CM | POA: Insufficient documentation

## 2018-04-17 DIAGNOSIS — F17210 Nicotine dependence, cigarettes, uncomplicated: Secondary | ICD-10-CM | POA: Insufficient documentation

## 2018-04-17 MED ORDER — KETOROLAC TROMETHAMINE 30 MG/ML IJ SOLN
30.0000 mg | Freq: Once | INTRAMUSCULAR | Status: AC
  Administered 2018-04-17: 30 mg via INTRAMUSCULAR
  Filled 2018-04-17: qty 1

## 2018-04-17 MED ORDER — KETOROLAC TROMETHAMINE 10 MG PO TABS: 10.0000 mg | ORAL_TABLET | Freq: Four times a day (QID) | ORAL | 0 refills | Status: AC | PRN

## 2018-04-17 MED ORDER — AMOXICILLIN 500 MG PO TABS: 500.0000 mg | ORAL_TABLET | Freq: Two times a day (BID) | ORAL | 0 refills | Status: AC

## 2018-04-17 NOTE — ED Triage Notes (Signed)
Patient ambulatory to triage with steady gait, without difficulty or distress noted; pt reports right sided dental pain x wk; taking OTC meds without relief; here recently for same but left prior to being seen due to long wait

## 2018-04-17 NOTE — ED Provider Notes (Signed)
[facility] Emergency Department Provider Note  ____________________________________________  Time seen: Approximately 8:52 PM  I have reviewed the triage vital signs and the nursing notes.   HISTORY  Chief Complaint Dental Pain    HPI Rhonda Delgado is a 37 y.o. female presents to the emergency department with right lower dental pain for the past week.  Patient has been taking Tylenol for pain but reports that it is not helping.  She does not have an appointment with a local dentist.  She has noticed mild right lower jaw swelling.  Patient reports that she is unable to make an appointment with a local dentist due to expense.  No fever or chills.  Pain is worse with chewing and with moving the jaw.   Past Medical History:  Diagnosis Date  . Asthma   . COPD (chronic obstructive pulmonary disease) (HCC)   . Hypertension     There are no active problems to display for this patient.   History reviewed. No pertinent surgical history.  Prior to Admission medications   Medication Sig Start Date End Date Taking? Authorizing Provider  amLODipine (NORVASC) 5 MG tablet Take 1 tablet (5 mg total) by mouth daily. 03/17/18 03/17/19  Enid Derry, PA-C  amoxicillin (AMOXIL) 500 MG tablet Take 1 tablet (500 mg total) by mouth 2 (two) times daily for 7 days. 04/17/18 04/24/18  Orvil Feil, PA-C  citalopram (CELEXA) 10 MG tablet Take 1 tablet (10 mg total) by mouth daily. Patient not taking: Reported on 03/19/2018 06/26/17 06/26/18  Minna Antis, MD  cloNIDine (CATAPRES) 0.1 MG tablet Take 1 tablet (0.1 mg total) by mouth 2 (two) times daily as needed (Top blood pressure (systolic) greater than 220, or bottom blood pressure (diastolic) greater than 120). Patient not taking: Reported on 03/19/2018 11/08/17 11/08/18  Governor Rooks, MD  ferrous sulfate 325 (65 FE) MG tablet Take 1 tablet (325 mg total) by mouth daily. Patient not taking: Reported on 03/19/2018 10/07/17 10/07/18   Rockne Menghini, MD  hydrochlorothiazide (HYDRODIURIL) 25 MG tablet Take 1 tablet (25 mg total) by mouth daily. 03/17/18   Enid Derry, PA-C  ketorolac (TORADOL) 10 MG tablet Take 1 tablet (10 mg total) by mouth every 6 (six) hours as needed for up to 5 days. 04/17/18 04/22/18  Orvil Feil, PA-C  lidocaine (XYLOCAINE) 2 % solution Use as directed 10 mLs in the mouth or throat as needed for mouth pain. 03/17/18   Enid Derry, PA-C  losartan (COZAAR) 50 MG tablet Take 1 tablet (50 mg total) by mouth daily. Patient not taking: Reported on 03/19/2018 09/26/17   Faythe Ghee, PA-C  metoprolol tartrate (LOPRESSOR) 25 MG tablet Take 1 tablet (25 mg total) by mouth 2 (two) times daily. Patient not taking: Reported on 03/19/2018 10/07/17 10/07/18  Rockne Menghini, MD    Allergies Patient has no known allergies.  No family history on file.  Social History Social History   Tobacco Use  . Smoking status: Current Every Day Smoker    Packs/day: 1.50    Types: Cigarettes  . Smokeless tobacco: Never Used  Substance Use Topics  . Alcohol use: Yes    Comment: occasionally  . Drug use: No     Review of Systems  Constitutional: No fever/chills Eyes: No visual changes. No discharge ENT: Patient has dental pain. Cardiovascular: no chest pain. Respiratory: no cough. No SOB. Gastrointestinal: No abdominal pain.  No nausea, no vomiting.  No diarrhea.  No constipation. Musculoskeletal: Negative  for musculoskeletal pain. Skin: Negative for rash, abrasions, lacerations, ecchymosis. Neurological: Negative for headaches, focal weakness or numbness.   ____________________________________________   PHYSICAL EXAM:  VITAL SIGNS: ED Triage Vitals [04/17/18 1914]  Enc Vitals Group     BP (!) 184/100     Pulse Rate 87     Resp 16     Temp 98.5 F (36.9 C)     Temp Source Oral     SpO2 99 %     Weight 260 lb (117.9 kg)     Height 5\' 6"  (1.676 m)     Head Circumference      Peak  Flow      Pain Score 8     Pain Loc      Pain Edu?      Excl. in GC?      Constitutional: Alert and oriented. Well appearing and in no acute distress. Eyes: Conjunctivae are normal. PERRL. EOMI. Head: Atraumatic. ENT:      Ears: TMs are pearly.      Nose: No congestion/rhinnorhea.      Mouth/Throat: Mucous membranes are moist.  Patient has multiple broken teeth of the right lower jaw.  Mild right lower jaw edema visualized. Neck: No stridor.  No cervical spine tenderness to palpation.  Cardiovascular: Normal rate, regular rhythm. Normal S1 and S2.  Good peripheral circulation. Respiratory: Normal respiratory effort without tachypnea or retractions. Lungs CTAB. Good air entry to the bases with no decreased or absent breath sounds..  Skin:  Skin is warm, dry and intact. No rash noted. Psychiatric: Mood and affect are normal. Speech and behavior are normal. Patient exhibits appropriate insight and judgement.   ____________________________________________   LABS (all labs ordered are listed, but only abnormal results are displayed)  Labs Reviewed - No data to display ____________________________________________  EKG   ____________________________________________  RADIOLOGY   No results found.  ____________________________________________    PROCEDURES  Procedure(s) performed:    Procedures    Medications  ketorolac (TORADOL) 30 MG/ML injection 30 mg (30 mg Intramuscular Given 04/17/18 1949)     ____________________________________________   INITIAL IMPRESSION / ASSESSMENT AND PLAN / ED COURSE  Pertinent labs & imaging results that were available during my care of the patient were reviewed by me and considered in my medical decision making (see chart for details).  Review of the [place] CSRS was performed in accordance of the [facility] prior to dispensing any controlled drugs.    Assessment and plan Dental pain Patient presents to the emergency department with  dental pain.  Multiple broken teeth along the right lower jaw were visualized on physical exam.  Patient has mild edema overlying the right lower jaw.  Patient was given an injection of Toradol in the emergency department and discharged with oral Toradol.  Patient was treated empirically with amoxicillin and advised to make an appointment with a local dentist as soon as possible.  Patient was advised to follow-up with her primary care provider regarding hypertension.  All patient questions were answered.    ____________________________________________  FINAL CLINICAL IMPRESSION(S) / ED DIAGNOSES  Final diagnoses:  Pain, dental      NEW MEDICATIONS STARTED DURING THIS VISIT:  ED Discharge Orders        Ordered    amoxicillin (AMOXIL) 500 MG tablet  2 times daily     04/17/18 1947    ketorolac (TORADOL) 10 MG tablet  Every 6 hours PRN     04/17/18 1947  This chart was dictated using voice recognition software/Dragon. Despite best efforts to proofread, errors can occur which can change the meaning. Any change was purely unintentional.    Gasper Lloyd 04/17/18 2100    Willy Eddy, MD 04/17/18 2153

## 2018-10-03 ENCOUNTER — Emergency Department
Admission: EM | Admit: 2018-10-03 | Discharge: 2018-10-03 | Disposition: A | Discharge status: Home / Self Care | Payer: Self-pay | Attending: Emergency Medicine | Admitting: Emergency Medicine

## 2018-10-03 ENCOUNTER — Encounter: Payer: Self-pay | Admitting: Emergency Medicine

## 2018-10-03 ENCOUNTER — Other Ambulatory Visit: Payer: Self-pay

## 2018-10-03 DIAGNOSIS — F17210 Nicotine dependence, cigarettes, uncomplicated: Secondary | ICD-10-CM | POA: Insufficient documentation

## 2018-10-03 DIAGNOSIS — J101 Influenza due to other identified influenza virus with other respiratory manifestations: Secondary | ICD-10-CM | POA: Insufficient documentation

## 2018-10-03 DIAGNOSIS — I10 Essential (primary) hypertension: Secondary | ICD-10-CM | POA: Insufficient documentation

## 2018-10-03 DIAGNOSIS — J449 Chronic obstructive pulmonary disease, unspecified: Secondary | ICD-10-CM | POA: Insufficient documentation

## 2018-10-03 DIAGNOSIS — Z9119 Patient's noncompliance with other medical treatment and regimen: Secondary | ICD-10-CM

## 2018-10-03 DIAGNOSIS — Z91199 Patient's noncompliance with other medical treatment and regimen due to unspecified reason: Secondary | ICD-10-CM

## 2018-10-03 DIAGNOSIS — Z79899 Other long term (current) drug therapy: Secondary | ICD-10-CM | POA: Insufficient documentation

## 2018-10-03 DIAGNOSIS — J45909 Unspecified asthma, uncomplicated: Secondary | ICD-10-CM | POA: Insufficient documentation

## 2018-10-03 LAB — INFLUENZA PANEL BY PCR (TYPE A & B)
INFLBPCR: NEGATIVE
Influenza A By PCR: POSITIVE — AB

## 2018-10-03 MED ORDER — OSELTAMIVIR PHOSPHATE 75 MG PO CAPS: 75.0000 mg | ORAL_CAPSULE | Freq: Two times a day (BID) | ORAL | 0 refills | Status: AC

## 2018-10-03 NOTE — Discharge Instructions (Signed)
Follow-up with 1 the clinics listed on your discharge papers.  The [facility] clinic also is available but you will need to call.  Begin taking Tamiflu for influenza the next 5 days.  Take Tylenol or ibuprofen as needed for fever or body aches.  You may obtain over-the-counter cough medication if needed.  Increase fluids.  You are contagious.  Also your blood pressure is elevated at 200/100.  Read information about hypertension and plan on continued treatment to prevent stroke or heart attack.

## 2018-10-03 NOTE — ED Triage Notes (Signed)
Patient ambulatory to triage with steady gait, without difficulty or distress noted, mask in place; pt st since yesterday having chills, bodyaches, cough/congestion

## 2018-10-03 NOTE — ED Notes (Signed)
Pt reports started yesterday with body aches, cough and fever. Has not taken any medications for her sx. Resp even unlabored with occasional faint wheezes noted.

## 2018-10-03 NOTE — ED Provider Notes (Signed)
[facility] Emergency Department Provider Note  ____________________________________________   None    (approximate)  I have reviewed the triage vital signs and the nursing notes.   HISTORY  Chief Complaint Generalized Body Aches   HPI Rhonda Delgado is a 38 y.o. female presents to the ED with complaint of sudden onset of cough, congestion, body aches and chills.  Patient states she has taken over-the-counter medication without relief.  She denies any vomiting or diarrhea.  She is unaware of any other family members sick at this time.  She rates her pain as an 8 out of 10.  Past Medical History:  Diagnosis Date  . Asthma   . COPD (chronic obstructive pulmonary disease) (HCC)   . Hypertension     There are no active problems to display for this patient.   History reviewed. No pertinent surgical history.  Prior to Admission medications   Medication Sig Start Date End Date Taking? Authorizing Provider  amLODipine (NORVASC) 5 MG tablet Take 1 tablet (5 mg total) by mouth daily. 03/17/18 03/17/19  Enid Derry, PA-C  citalopram (CELEXA) 10 MG tablet Take 1 tablet (10 mg total) by mouth daily. Patient not taking: Reported on 03/19/2018 06/26/17 06/26/18  Minna Antis, MD  cloNIDine (CATAPRES) 0.1 MG tablet Take 1 tablet (0.1 mg total) by mouth 2 (two) times daily as needed (Top blood pressure (systolic) greater than 220, or bottom blood pressure (diastolic) greater than 120). Patient not taking: Reported on 03/19/2018 11/08/17 11/08/18  Governor Rooks, MD  ferrous sulfate 325 (65 FE) MG tablet Take 1 tablet (325 mg total) by mouth daily. Patient not taking: Reported on 03/19/2018 10/07/17 10/07/18  Rockne Menghini, MD  hydrochlorothiazide (HYDRODIURIL) 25 MG tablet Take 1 tablet (25 mg total) by mouth daily. 03/17/18   Enid Derry, PA-C  lidocaine (XYLOCAINE) 2 % solution Use as directed 10 mLs in the mouth or throat as needed for mouth pain. 03/17/18    Enid Derry, PA-C  losartan (COZAAR) 50 MG tablet Take 1 tablet (50 mg total) by mouth daily. Patient not taking: Reported on 03/19/2018 09/26/17   Faythe Ghee, PA-C  metoprolol tartrate (LOPRESSOR) 25 MG tablet Take 1 tablet (25 mg total) by mouth 2 (two) times daily. Patient not taking: Reported on 03/19/2018 10/07/17 10/07/18  Rockne Menghini, MD  oseltamivir (TAMIFLU) 75 MG capsule Take 1 capsule (75 mg total) by mouth 2 (two) times daily for 5 days. 10/03/18 10/08/18  Tommi Rumps, PA-C    Allergies Patient has no known allergies.  No family history on file.  Social History Social History   Tobacco Use  . Smoking status: Current Every Day Smoker    Packs/day: 1.50    Types: Cigarettes  . Smokeless tobacco: Never Used  Substance Use Topics  . Alcohol use: Yes    Comment: occasionally  . Drug use: No    Review of Systems Constitutional: Positive fever/chills Eyes: No visual changes. ENT: No sore throat. Cardiovascular: Denies chest pain. Respiratory: Denies shortness of breath.  Positive cough. Gastrointestinal: No abdominal pain.  No nausea, no vomiting.   Musculoskeletal: Positive for body aches. Skin: Negative for rash. Neurological: Positive for headaches, negative for focal weakness or numbness. ___________________________________________   PHYSICAL EXAM:  VITAL SIGNS: ED Triage Vitals  Enc Vitals Group     BP 10/03/18 0616 (!) 200/100     Pulse Rate 10/03/18 0616 (!) 112     Resp 10/03/18 0616 20  Temp 10/03/18 0616 98.4 F (36.9 C)     Temp src --      SpO2 10/03/18 0616 97 %     Weight 10/03/18 0615 260 lb (117.9 kg)     Height 10/03/18 0615 5\' 6"  (1.676 m)     Head Circumference --      Peak Flow --      Pain Score 10/03/18 0615 8     Pain Loc --      Pain Edu? --      Excl. in GC? --    Constitutional: Alert and oriented. Well appearing and in no acute distress. Eyes: Conjunctivae are normal.  Head: Atraumatic. Nose: Moderate  congestion/rhinnorhea.  EACs and TMs are clear bilaterally. Mouth/Throat: Mucous membranes are moist.  Oropharynx non-erythematous. Neck: No stridor.   Hematological/Lymphatic/Immunilogical: No cervical lymphadenopathy. Cardiovascular: Normal rate, regular rhythm. Grossly normal heart sounds.  Good peripheral circulation. Respiratory: Normal respiratory effort.  No retractions. Lungs CTAB. Gastrointestinal: Soft and nontender. No distention.  Musculoskeletal: Moves upper and lower extremities without any difficulty.  Normal gait was noted. Neurologic:  Normal speech and language. No gross focal neurologic deficits are appreciated.  Skin:  Skin is warm, dry and intact. No rash noted. Psychiatric: Mood and affect are normal. Speech and behavior are normal.  ____________________________________________   LABS (all labs ordered are listed, but only abnormal results are displayed)  Labs Reviewed  INFLUENZA PANEL BY PCR (TYPE A & B) - Abnormal; Notable for the following components:      Result Value   Influenza A By PCR POSITIVE (*)    All other components within normal limits    PROCEDURES  Procedure(s) performed: None  Procedures  Critical Care performed: No  ____________________________________________   INITIAL IMPRESSION / ASSESSMENT AND PLAN / ED COURSE  As part of my medical decision making, I reviewed the following data within the electronic MEDICAL RECORD NUMBER Notes from prior ED visits and [place] Controlled Substance Database  Patient presents to the ED with sudden onset of body aches, cough, congestion and chills.  Patient did not get the flu shot this year.  Physical exam is consistent with influenza and she tested positive for influenza A.  Patient also has not taken her blood pressure medication in many years but is aware that she has hypertension.  We discussed this and she is encouraged to follow-up with the [facility] clinic or 1 of the clinics listed on her discharge  papers.  Patient is asymptomatic with her hypertension.  She was given a prescription for Tamiflu 75 mg twice daily for 5 days.   ____________________________________________   FINAL CLINICAL IMPRESSION(S) / ED DIAGNOSES  Final diagnoses:  Influenza A  Hypertension, uncontrolled  Medically noncompliant     ED Discharge Orders         Ordered    oseltamivir (TAMIFLU) 75 MG capsule  2 times daily     10/03/18 4132           Note:  This document was prepared using Dragon voice recognition software and may include unintentional dictation errors.    Tommi Rumps, PA-C 10/03/18 4401    Arnaldo Natal, MD 10/03/18 [phone]

## 2019-03-09 LAB — HM HIV SCREENING LAB: HM HIV Screening: NEGATIVE

## 2019-06-24 ENCOUNTER — Ambulatory Visit: Payer: Self-pay | Admitting: Family Medicine

## 2019-06-24 ENCOUNTER — Other Ambulatory Visit: Payer: Self-pay

## 2019-06-24 DIAGNOSIS — Z5321 Procedure and treatment not carried out due to patient leaving prior to being seen by health care provider: Secondary | ICD-10-CM

## 2019-06-24 NOTE — Progress Notes (Signed)
Client rescheduled appt. Didn't see provider today.

## 2019-06-24 NOTE — Progress Notes (Signed)
Pt declined to stay for appt today. Pt rescheduled for 06/26/2019. Pt not seen by provider.

## 2019-06-26 ENCOUNTER — Ambulatory Visit: Payer: Self-pay | Admitting: Advanced Practice Midwife

## 2019-06-26 ENCOUNTER — Encounter: Payer: Self-pay | Admitting: Advanced Practice Midwife

## 2019-06-26 DIAGNOSIS — Z62810 Personal history of physical and sexual abuse in childhood: Secondary | ICD-10-CM | POA: Insufficient documentation

## 2019-06-26 DIAGNOSIS — B9689 Other specified bacterial agents as the cause of diseases classified elsewhere: Secondary | ICD-10-CM

## 2019-06-26 DIAGNOSIS — I10 Essential (primary) hypertension: Secondary | ICD-10-CM

## 2019-06-26 DIAGNOSIS — N760 Acute vaginitis: Secondary | ICD-10-CM

## 2019-06-26 DIAGNOSIS — E1159 Type 2 diabetes mellitus with other circulatory complications: Secondary | ICD-10-CM

## 2019-06-26 DIAGNOSIS — Z113 Encounter for screening for infections with a predominantly sexual mode of transmission: Secondary | ICD-10-CM

## 2019-06-26 DIAGNOSIS — I152 Hypertension secondary to endocrine disorders: Secondary | ICD-10-CM | POA: Insufficient documentation

## 2019-06-26 DIAGNOSIS — F17200 Nicotine dependence, unspecified, uncomplicated: Secondary | ICD-10-CM

## 2019-06-26 DIAGNOSIS — Z91410 Personal history of adult physical and sexual abuse: Secondary | ICD-10-CM | POA: Insufficient documentation

## 2019-06-26 LAB — WET PREP FOR TRICH, YEAST, CLUE
Trichomonas Exam: NEGATIVE
Yeast Exam: NEGATIVE
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
## 2019-06-29 MED ORDER — METRONIDAZOLE 500 MG PO TABS: 500.0000 mg | ORAL_TABLET | Freq: Two times a day (BID) | ORAL | 0 refills | Status: AC

## 2019-06-29 NOTE — Progress Notes (Signed)
Wet mount reviewed by provider; per verbal order by Rhonda Delgado, pt treated for BV per standing order. Provider orders completed.

## 2019-06-29 NOTE — Addendum Note (Signed)
Addended by: Doy Mince on: 06/29/2019 11:49 AM   Modules accepted: Orders

## 2019-07-03 ENCOUNTER — Encounter: Payer: Self-pay | Admitting: Advanced Practice Midwife

## 2019-07-03 DIAGNOSIS — R87619 Unspecified abnormal cytological findings in specimens from cervix uteri: Secondary | ICD-10-CM | POA: Insufficient documentation

## 2019-07-03 LAB — IGP, APTIMA HPV
HPV Aptima: POSITIVE — AB
PAP Smear Comment: 0

## 2019-07-10 ENCOUNTER — Other Ambulatory Visit: Payer: Self-pay

## 2019-07-10 DIAGNOSIS — Z20822 Contact with and (suspected) exposure to covid-19: Secondary | ICD-10-CM

## 2019-07-11 LAB — NOVEL CORONAVIRUS, NAA: SARS-CoV-2, NAA: NOT DETECTED

## 2019-12-29 ENCOUNTER — Ambulatory Visit: Payer: Medicaid Other

## 2019-12-31 ENCOUNTER — Ambulatory Visit: Payer: Self-pay | Admitting: Physician Assistant

## 2019-12-31 ENCOUNTER — Encounter: Payer: Self-pay | Admitting: Physician Assistant

## 2019-12-31 ENCOUNTER — Other Ambulatory Visit: Payer: Self-pay

## 2019-12-31 DIAGNOSIS — B9689 Other specified bacterial agents as the cause of diseases classified elsewhere: Secondary | ICD-10-CM

## 2019-12-31 DIAGNOSIS — Z113 Encounter for screening for infections with a predominantly sexual mode of transmission: Secondary | ICD-10-CM

## 2019-12-31 DIAGNOSIS — N760 Acute vaginitis: Secondary | ICD-10-CM

## 2019-12-31 LAB — WET PREP FOR TRICH, YEAST, CLUE
Trichomonas Exam: NEGATIVE
Yeast Exam: NEGATIVE

## 2019-12-31 MED ORDER — METRONIDAZOLE 500 MG PO TABS: 500.0000 mg | ORAL_TABLET | Freq: Two times a day (BID) | ORAL | 0 refills | Status: AC

## 2019-12-31 NOTE — Progress Notes (Signed)
[employer] results reviewed. Patient treated for BV per provider orders. Tawny Hopping, RN
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
## 2019-12-31 NOTE — Progress Notes (Signed)
Here today for STD screening. Accepts bloodwork. Shaana Acocella, RN ° °

## 2020-01-05 LAB — GONOCOCCUS CULTURE

## 2020-05-11 ENCOUNTER — Ambulatory Visit: Payer: Medicaid Other

## 2020-05-13 ENCOUNTER — Ambulatory Visit: Payer: Medicaid Other

## 2020-06-03 ENCOUNTER — Encounter: Payer: Self-pay | Admitting: Advanced Practice Midwife

## 2020-06-03 ENCOUNTER — Ambulatory Visit: Payer: Self-pay | Admitting: Advanced Practice Midwife

## 2020-06-03 ENCOUNTER — Other Ambulatory Visit: Payer: Self-pay

## 2020-06-03 DIAGNOSIS — F339 Major depressive disorder, recurrent, unspecified: Secondary | ICD-10-CM

## 2020-06-03 DIAGNOSIS — B9689 Other specified bacterial agents as the cause of diseases classified elsewhere: Secondary | ICD-10-CM

## 2020-06-03 DIAGNOSIS — Z113 Encounter for screening for infections with a predominantly sexual mode of transmission: Secondary | ICD-10-CM

## 2020-06-03 DIAGNOSIS — N760 Acute vaginitis: Secondary | ICD-10-CM

## 2020-06-03 LAB — WET PREP FOR TRICH, YEAST, CLUE
Trichomonas Exam: NEGATIVE
Yeast Exam: NEGATIVE

## 2020-06-03 MED ORDER — METRONIDAZOLE 500 MG PO TABS: 500.0000 mg | ORAL_TABLET | Freq: Two times a day (BID) | ORAL | 0 refills | Status: AC

## 2020-06-03 NOTE — Progress Notes (Signed)
Patient here for STD screening.Laquana Villari Brewer-Jensen, RN 
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
## 2020-06-03 NOTE — Progress Notes (Signed)
Wet mount reviewed, patient treated for BV per SO. Scheduled for PE and follow-up pap.Burt Knack, RN

## 2020-06-07 LAB — GONOCOCCUS CULTURE

## 2020-06-14 ENCOUNTER — Encounter: Payer: Self-pay | Admitting: Family Medicine

## 2020-06-14 LAB — HM HIV SCREENING LAB: HM HIV Screening: NEGATIVE

## 2020-06-14 LAB — HM HEPATITIS C SCREENING LAB: HM Hepatitis Screen: NEGATIVE

## 2020-06-27 ENCOUNTER — Ambulatory Visit: Payer: Medicaid Other | Admitting: Family Medicine

## 2020-06-27 ENCOUNTER — Other Ambulatory Visit: Payer: Self-pay

## 2020-06-27 VITALS — BP 254/152 | Ht 66.0 in | Wt 251.4 lb

## 2020-06-27 DIAGNOSIS — Z01419 Encounter for gynecological examination (general) (routine) without abnormal findings: Secondary | ICD-10-CM

## 2020-06-27 DIAGNOSIS — F339 Major depressive disorder, recurrent, unspecified: Secondary | ICD-10-CM

## 2020-06-27 DIAGNOSIS — I152 Hypertension secondary to endocrine disorders: Secondary | ICD-10-CM

## 2020-06-27 NOTE — Progress Notes (Signed)
Pt to clinic for physical. Pt asking if we can check for cysts on ovaries; c/o heavy periods (changing pads every 30 min to 1 hour, this lasts about 3 days) and has them about every 2 weeks. Pt states she already had STD screening done at [facility] a few weeks ago and does not need that today. Pt c/o slight headache currently, believes it may be because she is hungry. States her last cigarette was about 1 to 1.5 hours ago; usually smoke about every 20 min. Discussed BP and headache with provider. Samara Snide, PA states she will be in to talk with pt.
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
## 2020-07-01 LAB — IGP, APTIMA HPV
HPV Aptima: POSITIVE — AB
PAP Smear Comment: 0

## 2020-07-04 ENCOUNTER — Telehealth: Payer: Self-pay

## 2020-07-04 NOTE — Telephone Encounter (Signed)
Telephone call to patient today regarding her PAP results (ASC-H and + HPV) and the need for a Colpo referral.  Patient reports having FPW-Medicaid.  She does not want to waste anyone's time or appointment slot, but she cannot accrue any out of pocket expenses.  I explained to her that the BCCCP program is for uninsured women and she could talk to [employer] regarding her concerns when she calls to schedule the appointment.  She does desire her referral to BCCCP.   BCCCP referral completed today. Hart Carwin, RN

## 2020-07-21 ENCOUNTER — Telehealth: Payer: Self-pay | Admitting: *Deleted

## 2020-08-02 ENCOUNTER — Ambulatory Visit: Payer: Self-pay | Attending: Oncology | Admitting: *Deleted

## 2020-08-02 ENCOUNTER — Encounter: Payer: Self-pay | Admitting: *Deleted

## 2020-08-02 ENCOUNTER — Other Ambulatory Visit: Payer: Self-pay

## 2020-08-02 DIAGNOSIS — R87611 Atypical squamous cells cannot exclude high grade squamous intraepithelial lesion on cytologic smear of cervix (ASC-H): Secondary | ICD-10-CM

## 2020-08-02 NOTE — Progress Notes (Signed)
39 year old female referred to BCCCP by the [facility] Department for financial assistance for further evaluation of recent abnormal pap of HPV positive ASC-H.  A televisit was completed to enroll patient into our BCCCP program and obtain her health history.  2 patient identifiers were used to ensure I was speaking to the correct patient.  Verbal consent given to enroll in BCCCP.  Patient has been screened for eligibility.  She does not have any insurance, Medicare or Medicaid.  She also meets financial eligibility.  Reviewed pap results with patient and explained need for coloposcopy.   She is scheduled to see Dr. Logan Bores tomorrow at 8;00 for colpo and possible biopsy.  Will follow up per BCCCP protocol.

## 2020-08-03 ENCOUNTER — Ambulatory Visit (INDEPENDENT_AMBULATORY_CARE_PROVIDER_SITE_OTHER): Payer: Self-pay | Admitting: Obstetrics and Gynecology

## 2020-08-03 ENCOUNTER — Other Ambulatory Visit: Payer: Self-pay | Admitting: Obstetrics and Gynecology

## 2020-08-03 ENCOUNTER — Other Ambulatory Visit: Payer: Self-pay

## 2020-08-03 ENCOUNTER — Other Ambulatory Visit: Payer: Self-pay | Admitting: Surgical

## 2020-08-03 ENCOUNTER — Encounter: Payer: Self-pay | Admitting: Obstetrics and Gynecology

## 2020-08-03 VITALS — BP 182/142 | HR 92 | Ht 66.0 in | Wt 258.5 lb

## 2020-08-03 DIAGNOSIS — B977 Papillomavirus as the cause of diseases classified elsewhere: Secondary | ICD-10-CM

## 2020-08-03 DIAGNOSIS — N72 Inflammatory disease of cervix uteri: Secondary | ICD-10-CM

## 2020-08-03 DIAGNOSIS — R87611 Atypical squamous cells cannot exclude high grade squamous intraepithelial lesion on cytologic smear of cervix (ASC-H): Secondary | ICD-10-CM

## 2020-08-03 MED ORDER — LOSARTAN POTASSIUM-HCTZ 50-12.5 MG PO TABS: 1.0000 | ORAL_TABLET | Freq: Every day | ORAL | 0 refills | Status: AC

## 2020-08-03 NOTE — Progress Notes (Signed)
Referring Provider:  BCCCP  HPI:  Rhonda Delgado is a 39 y.o.  G0P0000  who presents today for evaluation and management of abnormal cervical cytology. Dysplasia History:  Patient had positive HPV last year with negative cytology     She presents today with ASCUS high and positive HPV.  Of significant note-patient smokes cigarettes. ROS:  Pertinent items noted in HPI and remainder of comprehensive ROS otherwise negative.  OB History  Gravida Para Term Preterm AB Living  0 0 0 0 0 0  SAB TAB Ectopic Multiple Live Births  0 0 0 0 0    Past Medical History:  Diagnosis Date  . Asthma   . COPD (chronic obstructive pulmonary disease) (HCC)   . Hypertension     Past Surgical History:  Procedure Laterality Date  . denies      SOCIAL HISTORY: Social History   Substance and Sexual Activity  Alcohol Use Yes   Comment: maybe 3 x per year   Social History   Substance and Sexual Activity  Drug Use Not Currently  . Types: Marijuana     Family History  Problem Relation Age of Onset  . Heart disease Mother   . Hypertension Mother   . Alcohol abuse Father   . Cancer Father   . Alcohol abuse Paternal Grandmother   . Asthma Sister   . Breast cancer Paternal Aunt     ALLERGIES:  Patient has no known allergies.  She currently has no medications in their medication list.  Physical Exam: -Vitals:  BP (!) 182/142   Pulse 92   Ht 5\' 6"  (1.676 m)   Wt 258 lb 8 oz (117.3 kg)   BMI 41.72 kg/m   PROCEDURE: Colposcopy performed with 4% acetic acid after verbal consent obtained                           -Aceto-white Lesions Location(s): 5 and 7 o'clock.              -Biopsy performed at 5 and 7 o'clock               -ECC indicated and performed: No.     -Biopsy sites made hemostatic with pressure and Monsel's solution   -Satisfactory colposcopy: Yes.      -Evidence of Invasive cervical CA :  NO  ASSESSMENT:  Rhonda Delgado is a 39 y.o. G0P0000 here for  1. Atypical  squamous cells cannot exclude high grade squamous intraepithelial lesion on cytologic smear of cervix (ASC-H)   2. High risk human papilloma virus (HPV) infection of cervix   .  PLAN: 1.  I discussed the grading system of pap smears and HPV high risk viral types.  We will discuss management after colpo results return.  No orders of the defined types were placed in this encounter.          F/U  Return in about 2 weeks (around 08/17/2020) for Colpo f/u.  08/19/2020 ,MD 08/03/2020,8:51 AM

## 2020-08-08 LAB — ANATOMIC PATHOLOGY REPORT

## 2020-08-17 ENCOUNTER — Other Ambulatory Visit: Payer: Self-pay

## 2020-08-17 ENCOUNTER — Ambulatory Visit (INDEPENDENT_AMBULATORY_CARE_PROVIDER_SITE_OTHER): Payer: Self-pay | Admitting: Obstetrics and Gynecology

## 2020-08-17 ENCOUNTER — Encounter: Payer: Self-pay | Admitting: Obstetrics and Gynecology

## 2020-08-17 VITALS — BP 204/136 | HR 98 | Ht 66.0 in | Wt 257.6 lb

## 2020-08-17 DIAGNOSIS — N72 Inflammatory disease of cervix uteri: Secondary | ICD-10-CM

## 2020-08-17 DIAGNOSIS — I10 Essential (primary) hypertension: Secondary | ICD-10-CM

## 2020-08-17 DIAGNOSIS — B977 Papillomavirus as the cause of diseases classified elsewhere: Secondary | ICD-10-CM

## 2020-08-17 MED ORDER — METOPROLOL SUCCINATE ER 50 MG PO TB24: 50.0000 mg | ORAL_TABLET | Freq: Every day | ORAL | 1 refills | Status: AC
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
## 2020-08-24 NOTE — Progress Notes (Unsigned)
Per staff message 08/23/2020 by Samara Snide, PA:  Patient to have repeat PAP in 1 year (08/2021) as recommended by Dr. Logan Bores per f/u visit on 08/17/2020 (see note).  Colpo done on 08/03/2020. Hart Carwin, RN

## 2020-08-31 ENCOUNTER — Encounter: Payer: Self-pay | Admitting: *Deleted

## 2020-08-31 NOTE — Progress Notes (Signed)
Patient with benign findings on colpo biopsy.  Next pap in one year. She can have that completed at the [facility] or through [facility].

## 2020-12-05 ENCOUNTER — Telehealth: Payer: Self-pay | Admitting: *Deleted

## 2021-06-22 ENCOUNTER — Other Ambulatory Visit: Payer: Self-pay

## 2021-06-22 ENCOUNTER — Encounter: Payer: Self-pay | Admitting: Advanced Practice Midwife

## 2021-06-22 ENCOUNTER — Ambulatory Visit: Payer: Self-pay | Admitting: Advanced Practice Midwife

## 2021-06-22 DIAGNOSIS — Z113 Encounter for screening for infections with a predominantly sexual mode of transmission: Secondary | ICD-10-CM

## 2021-06-22 DIAGNOSIS — A64 Unspecified sexually transmitted disease: Secondary | ICD-10-CM

## 2021-06-22 LAB — WET PREP FOR TRICH, YEAST, CLUE
Trichomonas Exam: POSITIVE — AB
Yeast Exam: NEGATIVE

## 2021-06-22 MED ORDER — METRONIDAZOLE 500 MG PO TABS: 500.0000 mg | ORAL_TABLET | Freq: Two times a day (BID) | ORAL | 0 refills | Status: AC

## 2021-06-22 NOTE — Progress Notes (Addendum)
[employer] reviewed. Treatment for trich dispensed and given during clinic visit per SO. Education given. All questions answered.   Informed patient that physical and pap due after 08/03/21 and to call for appointment. Patient verbalized understanding.   Floy Sabina, RN

## 2021-06-22 NOTE — Addendum Note (Signed)
Addended by: Floy Sabina on: 06/22/2021 12:29 PM   Modules accepted: Orders
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
## 2021-06-26 LAB — GONOCOCCUS CULTURE

## 2021-08-02 ENCOUNTER — Ambulatory Visit: Payer: Medicaid Other | Admitting: Licensed Clinical Social Worker

## 2021-09-06 ENCOUNTER — Ambulatory Visit: Payer: Self-pay | Attending: Oncology

## 2021-09-06 ENCOUNTER — Other Ambulatory Visit: Payer: Self-pay

## 2021-09-06 VITALS — BP 256/173 | HR 82 | Temp 99.0°F | Resp 17

## 2021-09-06 DIAGNOSIS — R87612 Low grade squamous intraepithelial lesion on cytologic smear of cervix (LGSIL): Secondary | ICD-10-CM

## 2021-09-06 NOTE — Progress Notes (Signed)
Patient unable to have pap today.  She is menstruating.  Scheduled to return 09/13/21 for pap.  Refused to be seen in ED for elevated blood pressure.  Reviewed concerns about hypertension.  Allyn Kenner RN  called [facility] for scheduling information.  Gave patient appointment application.

## 2021-09-13 ENCOUNTER — Ambulatory Visit: Payer: Self-pay | Attending: Oncology | Admitting: *Deleted

## 2021-09-13 ENCOUNTER — Other Ambulatory Visit: Payer: Self-pay

## 2021-09-13 VITALS — BP 205/139 | HR 77 | Temp 98.9°F | Ht 66.0 in | Wt 257.0 lb

## 2021-09-13 DIAGNOSIS — Z0000 Encounter for general adult medical examination without abnormal findings: Secondary | ICD-10-CM

## 2021-09-13 NOTE — Progress Notes (Signed)
°  Subjective:     Patient ID: Rhonda Delgado, female   DOB: 02-02-1981, 40 y.o.   MRN: [id number]  HPI   Review of Systems     Objective:   Physical Exam Genitourinary:    Exam position: Lithotomy position.     Labia:        Right: No rash, tenderness, lesion or injury.        Left: No rash, tenderness, lesion or injury.      Vagina: No signs of injury and foreign body. Bleeding present. No vaginal discharge, erythema, tenderness, lesions or prolapsed vaginal walls.     Cervix: Cervical bleeding present. No cervical motion tenderness, discharge, friability, lesion, erythema or eversion.     Uterus: Not deviated and not enlarged.         Comments: Bloody discharge noted - patient had her period in the last 7 days     Assessment:     Patient returned to day for pap smear only.  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  Colpo with biopsy on 08/03/20 was negative.  For repeat pap only today.  Specimen collected for pap only.  Bloody discharge noted on exam.  Reviewed possibility have not getting a good specimen, and that she may have to return for repeat pap.  BP still elevated.  Patient states she completed the paperwork given to her last week for the [facility], but she states she has not been anywhere yet.  Coralee Rud, RN informed patient again of importance of getting treatment for her blood pressure.    Plan:     Specimen for pap sent to the lab.  Will follow up per [facility] protocol.

## 2021-09-15 LAB — IGP, APTIMA HPV: HPV Aptima: NEGATIVE

## 2021-09-19 ENCOUNTER — Encounter: Payer: Self-pay | Admitting: *Deleted

## 2021-09-19 NOTE — Progress Notes (Signed)
Letter mailed to inform patient of her pap results and need to repeat pap in 1 year.

## 2021-11-15 ENCOUNTER — Encounter: Payer: Self-pay | Admitting: Family Medicine

## 2021-11-15 ENCOUNTER — Ambulatory Visit: Payer: Self-pay | Admitting: Family Medicine

## 2021-11-15 ENCOUNTER — Other Ambulatory Visit: Payer: Self-pay

## 2021-11-15 DIAGNOSIS — Z113 Encounter for screening for infections with a predominantly sexual mode of transmission: Secondary | ICD-10-CM

## 2021-11-15 LAB — WET PREP FOR TRICH, YEAST, CLUE
Trichomonas Exam: NEGATIVE
Yeast Exam: NEGATIVE

## 2021-11-15 NOTE — Progress Notes (Signed)
Pt here for STD screening.  Wet mount results reviewed, no treatment required per SO.  Pt declined condoms.  Veronia Laprise M Frieda Arnall, RN ° °
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
## 2022-03-18 ENCOUNTER — Emergency Department
Admission: EM | Admit: 2022-03-18 | Discharge: 2022-03-18 | Disposition: A | Discharge status: Other Acute Inpatient Hospital | Payer: Medicaid Other | Attending: Emergency Medicine | Admitting: Emergency Medicine

## 2022-03-18 ENCOUNTER — Inpatient Hospital Stay (HOSPITAL_COMMUNITY)
Admission: AD | Admit: 2022-03-18 | Discharge: 2022-05-01 | DRG: 003 | Disposition: E | Payer: Medicaid Other | Source: Other Acute Inpatient Hospital | Attending: Neurosurgery | Admitting: Neurosurgery

## 2022-03-18 ENCOUNTER — Inpatient Hospital Stay (HOSPITAL_COMMUNITY): Payer: Medicaid Other

## 2022-03-18 ENCOUNTER — Emergency Department: Payer: Medicaid Other

## 2022-03-18 DIAGNOSIS — J9811 Atelectasis: Secondary | ICD-10-CM | POA: Diagnosis present

## 2022-03-18 DIAGNOSIS — R0689 Other abnormalities of breathing: Secondary | ICD-10-CM | POA: Diagnosis not present

## 2022-03-18 DIAGNOSIS — R4182 Altered mental status, unspecified: Secondary | ICD-10-CM | POA: Diagnosis present

## 2022-03-18 DIAGNOSIS — J449 Chronic obstructive pulmonary disease, unspecified: Secondary | ICD-10-CM | POA: Insufficient documentation

## 2022-03-18 DIAGNOSIS — R739 Hyperglycemia, unspecified: Secondary | ICD-10-CM | POA: Diagnosis not present

## 2022-03-18 DIAGNOSIS — Z6841 Body Mass Index (BMI) 40.0 and over, adult: Secondary | ICD-10-CM

## 2022-03-18 DIAGNOSIS — X58XXXA Exposure to other specified factors, initial encounter: Secondary | ICD-10-CM | POA: Diagnosis present

## 2022-03-18 DIAGNOSIS — Z91012 Allergy to eggs: Secondary | ICD-10-CM

## 2022-03-18 DIAGNOSIS — I608 Other nontraumatic subarachnoid hemorrhage: Principal | ICD-10-CM

## 2022-03-18 DIAGNOSIS — J690 Pneumonitis due to inhalation of food and vomit: Secondary | ICD-10-CM

## 2022-03-18 DIAGNOSIS — J9601 Acute respiratory failure with hypoxia: Secondary | ICD-10-CM | POA: Diagnosis present

## 2022-03-18 DIAGNOSIS — Z8249 Family history of ischemic heart disease and other diseases of the circulatory system: Secondary | ICD-10-CM

## 2022-03-18 DIAGNOSIS — J810 Acute pulmonary edema: Secondary | ICD-10-CM | POA: Diagnosis present

## 2022-03-18 DIAGNOSIS — H5704 Mydriasis: Secondary | ICD-10-CM | POA: Diagnosis present

## 2022-03-18 DIAGNOSIS — E872 Acidosis, unspecified: Secondary | ICD-10-CM | POA: Diagnosis present

## 2022-03-18 DIAGNOSIS — I469 Cardiac arrest, cause unspecified: Secondary | ICD-10-CM | POA: Diagnosis not present

## 2022-03-18 DIAGNOSIS — R1310 Dysphagia, unspecified: Secondary | ICD-10-CM | POA: Diagnosis not present

## 2022-03-18 DIAGNOSIS — T783XXA Angioneurotic edema, initial encounter: Secondary | ICD-10-CM | POA: Diagnosis present

## 2022-03-18 DIAGNOSIS — T380X5A Adverse effect of glucocorticoids and synthetic analogues, initial encounter: Secondary | ICD-10-CM | POA: Diagnosis not present

## 2022-03-18 DIAGNOSIS — I729 Aneurysm of unspecified site: Secondary | ICD-10-CM

## 2022-03-18 DIAGNOSIS — F17210 Nicotine dependence, cigarettes, uncomplicated: Secondary | ICD-10-CM | POA: Diagnosis present

## 2022-03-18 DIAGNOSIS — G936 Cerebral edema: Secondary | ICD-10-CM | POA: Diagnosis present

## 2022-03-18 DIAGNOSIS — Z91148 Patient's other noncompliance with medication regimen for other reason: Secondary | ICD-10-CM | POA: Diagnosis not present

## 2022-03-18 DIAGNOSIS — S066XAA Traumatic subarachnoid hemorrhage with loss of consciousness status unknown, initial encounter: Secondary | ICD-10-CM | POA: Diagnosis not present

## 2022-03-18 DIAGNOSIS — I619 Nontraumatic intracerebral hemorrhage, unspecified: Secondary | ICD-10-CM | POA: Diagnosis not present

## 2022-03-18 DIAGNOSIS — I161 Hypertensive emergency: Secondary | ICD-10-CM

## 2022-03-18 DIAGNOSIS — G4089 Other seizures: Secondary | ICD-10-CM | POA: Diagnosis not present

## 2022-03-18 DIAGNOSIS — I10 Essential (primary) hypertension: Secondary | ICD-10-CM | POA: Diagnosis present

## 2022-03-18 DIAGNOSIS — I614 Nontraumatic intracerebral hemorrhage in cerebellum: Secondary | ICD-10-CM | POA: Diagnosis not present

## 2022-03-18 DIAGNOSIS — E876 Hypokalemia: Secondary | ICD-10-CM | POA: Diagnosis present

## 2022-03-18 DIAGNOSIS — I6012 Nontraumatic subarachnoid hemorrhage from left middle cerebral artery: Principal | ICD-10-CM | POA: Diagnosis present

## 2022-03-18 DIAGNOSIS — I611 Nontraumatic intracerebral hemorrhage in hemisphere, cortical: Secondary | ICD-10-CM

## 2022-03-18 DIAGNOSIS — Z825 Family history of asthma and other chronic lower respiratory diseases: Secondary | ICD-10-CM | POA: Diagnosis not present

## 2022-03-18 DIAGNOSIS — Z794 Long term (current) use of insulin: Secondary | ICD-10-CM | POA: Diagnosis not present

## 2022-03-18 DIAGNOSIS — E119 Type 2 diabetes mellitus without complications: Secondary | ICD-10-CM | POA: Diagnosis not present

## 2022-03-18 DIAGNOSIS — Z8679 Personal history of other diseases of the circulatory system: Secondary | ICD-10-CM | POA: Diagnosis not present

## 2022-03-18 DIAGNOSIS — F419 Anxiety disorder, unspecified: Secondary | ICD-10-CM | POA: Diagnosis present

## 2022-03-18 DIAGNOSIS — I6389 Other cerebral infarction: Secondary | ICD-10-CM | POA: Diagnosis not present

## 2022-03-18 DIAGNOSIS — J45909 Unspecified asthma, uncomplicated: Secondary | ICD-10-CM | POA: Insufficient documentation

## 2022-03-18 DIAGNOSIS — I609 Nontraumatic subarachnoid hemorrhage, unspecified: Secondary | ICD-10-CM | POA: Diagnosis not present

## 2022-03-18 DIAGNOSIS — D509 Iron deficiency anemia, unspecified: Secondary | ICD-10-CM | POA: Diagnosis present

## 2022-03-18 DIAGNOSIS — J988 Other specified respiratory disorders: Secondary | ICD-10-CM | POA: Diagnosis not present

## 2022-03-18 LAB — URINE DRUG SCREEN, QUALITATIVE (ARMC ONLY)
Amphetamines, Ur Screen: NOT DETECTED
Barbiturates, Ur Screen: NOT DETECTED
Benzodiazepine, Ur Scrn: NOT DETECTED
Cannabinoid 50 Ng, Ur ~~LOC~~: NOT DETECTED
Cocaine Metabolite,Ur ~~LOC~~: NOT DETECTED
MDMA (Ecstasy)Ur Screen: NOT DETECTED
Methadone Scn, Ur: NOT DETECTED
Opiate, Ur Screen: NOT DETECTED
Phencyclidine (PCP) Ur S: NOT DETECTED
Tricyclic, Ur Screen: NOT DETECTED

## 2022-03-18 LAB — BLOOD GAS, ARTERIAL
Acid-base deficit: 2 mmol/L (ref 0.0–2.0)
Bicarbonate: 23.1 mmol/L (ref 20.0–28.0)
FIO2: 40 %
MECHVT: 500 mL
Mechanical Rate: 16
O2 Saturation: 97.5 %
PEEP: 5 cmH2O
Patient temperature: 37
pCO2 arterial: 40 mmHg (ref 32–48)
pH, Arterial: 7.37 (ref 7.35–7.45)
pO2, Arterial: 87 mmHg (ref 83–108)

## 2022-03-18 LAB — URINALYSIS, COMPLETE (UACMP) WITH MICROSCOPIC
Bilirubin Urine: NEGATIVE
Glucose, UA: NEGATIVE mg/dL
Hgb urine dipstick: NEGATIVE
Ketones, ur: NEGATIVE mg/dL
Leukocytes,Ua: NEGATIVE
Nitrite: NEGATIVE
Protein, ur: 100 mg/dL — AB
Specific Gravity, Urine: 1.012 (ref 1.005–1.030)
pH: 8 (ref 5.0–8.0)

## 2022-03-18 LAB — CBC
HCT: 36 % (ref 36.0–46.0)
Hemoglobin: 10.2 g/dL — ABNORMAL LOW (ref 12.0–15.0)
MCH: 21.9 pg — ABNORMAL LOW (ref 26.0–34.0)
MCHC: 28.3 g/dL — ABNORMAL LOW (ref 30.0–36.0)
MCV: 77.3 fL — ABNORMAL LOW (ref 80.0–100.0)
Platelets: 340 10*3/uL (ref 150–400)
RBC: 4.66 MIL/uL (ref 3.87–5.11)
RDW: 18.9 % — ABNORMAL HIGH (ref 11.5–15.5)
WBC: 7.8 10*3/uL (ref 4.0–10.5)
nRBC: 0 % (ref 0.0–0.2)

## 2022-03-18 LAB — COMPREHENSIVE METABOLIC PANEL
ALT: 12 U/L (ref 0–44)
AST: 21 U/L (ref 15–41)
Albumin: 3.9 g/dL (ref 3.5–5.0)
Alkaline Phosphatase: 72 U/L (ref 38–126)
Anion gap: 9 (ref 5–15)
BUN: 9 mg/dL (ref 6–20)
CO2: 22 mmol/L (ref 22–32)
Calcium: 9.2 mg/dL (ref 8.9–10.3)
Chloride: 106 mmol/L (ref 98–111)
Creatinine, Ser: 0.76 mg/dL (ref 0.44–1.00)
GFR, Estimated: >60 mL/min (ref >60)
Glucose, Bld: 114 mg/dL — ABNORMAL HIGH (ref 70–99)
Potassium: 3.4 mmol/L — ABNORMAL LOW (ref 3.5–5.1)
Sodium: 137 mmol/L (ref 135–145)
Total Bilirubin: 0.5 mg/dL (ref 0.3–1.2)
Total Protein: 8.4 g/dL — ABNORMAL HIGH (ref 6.5–8.1)

## 2022-03-18 LAB — GLUCOSE, CAPILLARY: Glucose-Capillary: 137 mg/dL — ABNORMAL HIGH (ref 70–99)

## 2022-03-18 LAB — MRSA NEXT GEN BY PCR, NASAL: MRSA by PCR Next Gen: NOT DETECTED

## 2022-03-18 LAB — PREGNANCY, URINE: Preg Test, Ur: NEGATIVE

## 2022-03-18 LAB — ETHANOL: Alcohol, Ethyl (B): <10 mg/dL (ref <10)

## 2022-03-18 MED ORDER — DOCUSATE SODIUM 100 MG PO CAPS
100.0000 mg | ORAL_CAPSULE | Freq: Two times a day (BID) | ORAL | Status: DC | PRN
Start: 2022-03-18 — End: 2022-03-18

## 2022-03-18 MED ORDER — SODIUM CHLORIDE 0.9 % IV BOLUS
1000.0000 mL | Freq: Once | INTRAVENOUS | Status: AC
  Administered 2022-03-18: 1000 mL via INTRAVENOUS

## 2022-03-18 MED ORDER — PANTOPRAZOLE SODIUM 40 MG IV SOLR
40.0000 mg | Freq: Every day | INTRAVENOUS | Status: DC
  Administered 2022-03-18: 40 mg via INTRAVENOUS
  Filled 2022-03-18: qty 10

## 2022-03-18 MED ORDER — FENTANYL CITRATE PF 50 MCG/ML IJ SOSY
50.0000 ug | PREFILLED_SYRINGE | Freq: Once | INTRAMUSCULAR | Status: DC
  Filled 2022-03-18: qty 1

## 2022-03-18 MED ORDER — DOCUSATE SODIUM 50 MG/5ML PO LIQD
100.0000 mg | Freq: Two times a day (BID) | ORAL | Status: DC
  Administered 2022-03-18 – 2022-03-22 (×7): 100 mg
  Filled 2022-03-18 (×7): qty 10

## 2022-03-18 MED ORDER — ORAL CARE MOUTH RINSE
15.0000 mL | OROMUCOSAL | Status: DC
  Administered 2022-03-19 – 2022-03-20 (×13): 15 mL via OROMUCOSAL

## 2022-03-18 MED ORDER — LABETALOL HCL 5 MG/ML IV SOLN: 20.0000 mg | Freq: Once | INTRAVENOUS | Status: AC

## 2022-03-18 MED ORDER — POLYETHYLENE GLYCOL 3350 17 G PO PACK
17.0000 g | PACK | Freq: Every day | ORAL | Status: DC
  Administered 2022-03-19 – 2022-03-21 (×3): 17 g
  Filled 2022-03-18 (×3): qty 1

## 2022-03-18 MED ORDER — PANTOPRAZOLE 2 MG/ML SUSPENSION: 40.0000 mg | Freq: Every day | ORAL | Status: DC

## 2022-03-18 MED ORDER — SUCCINYLCHOLINE CHLORIDE 200 MG/10ML IV SOSY
130.0000 mg | PREFILLED_SYRINGE | Freq: Once | INTRAVENOUS | Status: AC
  Administered 2022-03-18: 130 mg via INTRAVENOUS
  Filled 2022-03-18: qty 10

## 2022-03-18 MED ORDER — FENTANYL CITRATE PF 50 MCG/ML IJ SOSY: 50.0000 ug | PREFILLED_SYRINGE | INTRAMUSCULAR | Status: DC | PRN

## 2022-03-18 MED ORDER — NICARDIPINE HCL IN NACL 20-0.86 MG/200ML-% IV SOLN
3.0000 mg/h | INTRAVENOUS | Status: DC
  Administered 2022-03-18: 5 mg/h via INTRAVENOUS
  Filled 2022-03-18 (×2): qty 200

## 2022-03-18 MED ORDER — MIDAZOLAM HCL 2 MG/2ML IJ SOLN: 2.0000 mg | Freq: Once | INTRAMUSCULAR | Status: DC

## 2022-03-18 MED ORDER — ONDANSETRON HCL 4 MG/2ML IJ SOLN: 4.0000 mg | Freq: Four times a day (QID) | INTRAMUSCULAR | Status: DC | PRN

## 2022-03-18 MED ORDER — NICARDIPINE HCL IN NACL 20-0.86 MG/200ML-% IV SOLN
3.0000 mg/h | INTRAVENOUS | Status: DC
  Administered 2022-03-18: 5 mg/h via INTRAVENOUS
  Administered 2022-03-19: 15 mg/h via INTRAVENOUS
  Administered 2022-03-19: 7.5 mg/h via INTRAVENOUS
  Administered 2022-03-19 – 2022-03-21 (×21): 15 mg/h via INTRAVENOUS
  Administered 2022-03-21: 12.5 mg/h via INTRAVENOUS
  Administered 2022-03-21 (×3): 15 mg/h via INTRAVENOUS
  Administered 2022-03-21: 12.5 mg/h via INTRAVENOUS
  Administered 2022-03-21: 10 mg/h via INTRAVENOUS
  Administered 2022-03-21 (×3): 15 mg/h via INTRAVENOUS
  Administered 2022-03-21 (×2): 10 mg/h via INTRAVENOUS
  Administered 2022-03-21 – 2022-03-22 (×3): 15 mg/h via INTRAVENOUS
  Administered 2022-03-22: 10 mg/h via INTRAVENOUS
  Administered 2022-03-22: 15 mg/h via INTRAVENOUS
  Administered 2022-03-22: 7.5 mg/h via INTRAVENOUS
  Filled 2022-03-18: qty 600
  Filled 2022-03-18 (×7): qty 200
  Filled 2022-03-18: qty 400
  Filled 2022-03-18: qty 200
  Filled 2022-03-18: qty 400
  Filled 2022-03-18 (×6): qty 200
  Filled 2022-03-18 (×2): qty 400
  Filled 2022-03-18: qty 600
  Filled 2022-03-18: qty 200
  Filled 2022-03-18: qty 400
  Filled 2022-03-18: qty 200
  Filled 2022-03-18: qty 400
  Filled 2022-03-18 (×4): qty 200
  Filled 2022-03-18: qty 600
  Filled 2022-03-18: qty 400
  Filled 2022-03-18: qty 200

## 2022-03-18 MED ORDER — NIMODIPINE 30 MG PO CAPS: 60.0000 mg | ORAL_CAPSULE | ORAL | Status: DC

## 2022-03-18 MED ORDER — IPRATROPIUM-ALBUTEROL 0.5-2.5 (3) MG/3ML IN SOLN
3.0000 mL | Freq: Four times a day (QID) | RESPIRATORY_TRACT | Status: DC | PRN
Start: 2022-03-18 — End: 2022-03-24
  Administered 2022-03-21: 3 mL via RESPIRATORY_TRACT
  Filled 2022-03-18: qty 3

## 2022-03-18 MED ORDER — PROPOFOL 1000 MG/100ML IV EMUL
0.0000 ug/kg/min | INTRAVENOUS | Status: DC
  Administered 2022-03-18: 50 ug/kg/min via INTRAVENOUS
  Administered 2022-03-19 (×2): 30 ug/kg/min via INTRAVENOUS
  Filled 2022-03-18 (×3): qty 100

## 2022-03-18 MED ORDER — STROKE: EARLY STAGES OF RECOVERY BOOK
Freq: Once | Status: AC
  Filled 2022-03-18: qty 1

## 2022-03-18 MED ORDER — FENTANYL BOLUS VIA INFUSION: 50.0000 ug | INTRAVENOUS | Status: DC | PRN

## 2022-03-18 MED ORDER — LIDOCAINE HCL (CARDIAC) PF 100 MG/5ML IV SOSY
80.0000 mg | PREFILLED_SYRINGE | Freq: Once | INTRAVENOUS | Status: AC
  Administered 2022-03-18: 80 mg via INTRAVENOUS
  Filled 2022-03-18: qty 5

## 2022-03-18 MED ORDER — DOCUSATE SODIUM 50 MG/5ML PO LIQD
100.0000 mg | Freq: Two times a day (BID) | ORAL | Status: DC | PRN
Start: 2022-03-18 — End: 2022-03-19

## 2022-03-18 MED ORDER — ACETAMINOPHEN 160 MG/5ML PO SOLN
650.0000 mg | ORAL | Status: DC | PRN
  Administered 2022-03-20 – 2022-03-28 (×8): 650 mg
  Filled 2022-03-18 (×8): qty 20.3

## 2022-03-18 MED ORDER — POLYETHYLENE GLYCOL 3350 17 G PO PACK: 17.0000 g | PACK | Freq: Every day | ORAL | Status: DC | PRN

## 2022-03-18 MED ORDER — LABETALOL HCL 5 MG/ML IV SOLN
INTRAVENOUS | Status: AC
  Filled 2022-03-18: qty 4

## 2022-03-18 MED ORDER — ETOMIDATE 2 MG/ML IV SOLN
20.0000 mg | Freq: Once | INTRAVENOUS | Status: AC
  Administered 2022-03-18: 20 mg via INTRAVENOUS
  Filled 2022-03-18: qty 10

## 2022-03-18 MED ORDER — ACETAMINOPHEN 325 MG PO TABS
650.0000 mg | ORAL_TABLET | ORAL | Status: DC | PRN
  Administered 2022-03-23 – 2022-03-31 (×9): 650 mg
  Filled 2022-03-18 (×9): qty 2

## 2022-03-18 MED ORDER — FENTANYL 2500MCG IN NS 250ML (10MCG/ML) PREMIX INFUSION
50.0000 ug/h | INTRAVENOUS | Status: DC
  Administered 2022-03-18: 50 ug/h via INTRAVENOUS
  Filled 2022-03-18: qty 250

## 2022-03-18 MED ORDER — PROPOFOL 1000 MG/100ML IV EMUL
INTRAVENOUS | Status: AC
  Administered 2022-03-18: 10 ug/kg/min via INTRAVENOUS
  Filled 2022-03-18: qty 100

## 2022-03-18 MED ORDER — IOHEXOL 350 MG/ML SOLN
75.0000 mL | Freq: Once | INTRAVENOUS | Status: AC | PRN
  Administered 2022-03-18: 75 mL via INTRAVENOUS

## 2022-03-18 MED ORDER — SODIUM CHLORIDE 0.9 % IV SOLN: 500.0000 mg | Freq: Once | INTRAVENOUS | Status: DC

## 2022-03-18 MED ORDER — ONDANSETRON HCL 4 MG/2ML IJ SOLN
4.0000 mg | Freq: Once | INTRAMUSCULAR | Status: AC
  Administered 2022-03-18: 4 mg via INTRAVENOUS
  Filled 2022-03-18: qty 2

## 2022-03-18 MED ORDER — MIDAZOLAM-SODIUM CHLORIDE 100-0.9 MG/100ML-% IV SOLN
0.5000 mg/h | INTRAVENOUS | Status: DC
  Administered 2022-03-18: 0.5 mg/h via INTRAVENOUS
  Filled 2022-03-18: qty 100

## 2022-03-18 MED ORDER — SODIUM CHLORIDE 0.9 % IV SOLN
3.0000 g | Freq: Once | INTRAVENOUS | Status: AC
  Administered 2022-03-18: 3 g via INTRAVENOUS
  Filled 2022-03-18: qty 8

## 2022-03-18 MED ORDER — FENTANYL CITRATE PF 50 MCG/ML IJ SOSY
50.0000 ug | PREFILLED_SYRINGE | Freq: Once | INTRAMUSCULAR | Status: AC
  Administered 2022-03-18: 50 ug via INTRAVENOUS
  Filled 2022-03-18: qty 1

## 2022-03-18 MED ORDER — INSULIN ASPART 100 UNIT/ML IJ SOLN
0.0000 [IU] | INTRAMUSCULAR | Status: DC
  Administered 2022-03-19: 2 [IU] via SUBCUTANEOUS
  Administered 2022-03-19: 3 [IU] via SUBCUTANEOUS
  Administered 2022-03-19 – 2022-03-21 (×7): 2 [IU] via SUBCUTANEOUS
  Administered 2022-03-21: 3 [IU] via SUBCUTANEOUS
  Administered 2022-03-21 (×2): 2 [IU] via SUBCUTANEOUS
  Administered 2022-03-21: 3 [IU] via SUBCUTANEOUS
  Administered 2022-03-21: 2 [IU] via SUBCUTANEOUS
  Administered 2022-03-22 (×3): 3 [IU] via SUBCUTANEOUS
  Administered 2022-03-22 – 2022-03-23 (×4): 2 [IU] via SUBCUTANEOUS
  Administered 2022-03-23: 3 [IU] via SUBCUTANEOUS
  Administered 2022-03-23 (×2): 2 [IU] via SUBCUTANEOUS
  Administered 2022-03-23: 5 [IU] via SUBCUTANEOUS
  Administered 2022-03-23 – 2022-03-24 (×2): 2 [IU] via SUBCUTANEOUS
  Administered 2022-03-24 (×4): 3 [IU] via SUBCUTANEOUS
  Administered 2022-03-24: 5 [IU] via SUBCUTANEOUS
  Administered 2022-03-25 – 2022-03-30 (×25): 2 [IU] via SUBCUTANEOUS

## 2022-03-18 MED ORDER — LABETALOL HCL 5 MG/ML IV SOLN
INTRAVENOUS | Status: AC
  Administered 2022-03-18: 20 mg via INTRAVENOUS
  Filled 2022-03-18: qty 4

## 2022-03-18 MED ORDER — PROPOFOL 1000 MG/100ML IV EMUL
5.0000 ug/kg/min | INTRAVENOUS | Status: DC
  Administered 2022-03-18: 20 ug/kg/min via INTRAVENOUS
  Filled 2022-03-18: qty 100

## 2022-03-18 MED ORDER — ACETAMINOPHEN 650 MG RE SUPP: 650.0000 mg | RECTAL | Status: DC | PRN

## 2022-03-18 MED ORDER — POTASSIUM CHLORIDE 20 MEQ PO PACK
40.0000 meq | PACK | Freq: Once | ORAL | Status: AC
  Administered 2022-03-18: 40 meq
  Filled 2022-03-18: qty 2

## 2022-03-18 MED ORDER — ORAL CARE MOUTH RINSE: 15.0000 mL | OROMUCOSAL | Status: DC | PRN

## 2022-03-18 MED ORDER — NIMODIPINE 6 MG/ML PO SOLN
60.0000 mg | ORAL | Status: DC
  Administered 2022-03-18 – 2022-04-01 (×79): 60 mg
  Filled 2022-03-18 (×81): qty 10

## 2022-03-18 MED ORDER — SODIUM CHLORIDE 0.9 % IV SOLN
3.0000 g | Freq: Four times a day (QID) | INTRAVENOUS | Status: DC
  Administered 2022-03-19 – 2022-03-23 (×17): 3 g via INTRAVENOUS
  Filled 2022-03-18 (×18): qty 8
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
## 2022-03-18 NOTE — ED Notes (Signed)
Unable to assess most of triage questions due to pt not answering.

## 2022-03-18 NOTE — ED Notes (Signed)
Pt occasionally reaching for et tube, mits placed.

## 2022-03-18 NOTE — Consult Note (Signed)
Reason for Consult:SAH, ruptured left mca anerurysm Referring Physician: ccm  Rhonda Delgado is an 41 y.o. female.  HPI: whom was found unresponsive by family member. EMS called, BP was over 200. Not following commands, and nonverbal during the transfer to [facility]. Head CT revealed a left frontal ICH. 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 Transferred to [facility]. Patient was intubated due to multiple episodes of emesis during transport, and arrival to [facility]. Started on unasyn, zithromax. Placed on nicardipine infusion, and BP was improved upon transfer. Upon arrival to [facility], left pupil dilated 67mm, right pinpoint. Left pupil not reactive.  Past Medical History:  Diagnosis Date   Asthma    COPD (chronic obstructive pulmonary disease) (HCC)    Hypertension     Past Surgical History:  Procedure Laterality Date   denies      Family History  Problem Relation Age of Onset   Heart disease Mother    Hypertension Mother    Alcohol abuse Father    Cancer Father    Alcohol abuse Paternal Grandmother    Asthma Sister    Breast cancer Paternal Aunt     Social History:  reports that she has been smoking cigarettes and cigars. She has a 20.00 pack-year smoking history. She has never used smokeless tobacco. She reports current alcohol use of about 2.0 standard drinks of alcohol per week. She reports that she does not currently use drugs after having used the following drugs: Marijuana.  Allergies:  Allergies  Allergen Reactions   Eggs Or Egg-Derived Products Other (See Comments)    Abdominal cramping     Medications: I have reviewed the patient's current medications.  Results for orders placed or performed during the hospital encounter of 03/17/2022 (from the past 48 hour(s))  CBC     Status: Abnormal   Collection Time: 03/15/2022  4:45 PM  Result Value Ref Range   WBC 7.8 4.0 - 10.5 K/uL   RBC 4.66 3.87 - 5.11 MIL/uL   Hemoglobin 10.2 (L) 12.0 - 15.0 g/dL   HCT 16.1 09.6 - 04.5 %    MCV 77.3 (L) 80.0 - 100.0 fL   MCH 21.9 (L) 26.0 - 34.0 pg   MCHC 28.3 (L) 30.0 - 36.0 g/dL   RDW 40.9 (H) 81.1 - 91.4 %   Platelets 340 150 - 400 K/uL   nRBC 0.0 0.0 - 0.2 %    Comment: Performed at [facility], [address] [place], [place] [address]  Comprehensive metabolic panel     Status: Abnormal   Collection Time: 03/24/2022  4:45 PM  Result Value Ref Range   Sodium 137 135 - 145 mmol/L   Potassium 3.4 (L) 3.5 - 5.1 mmol/L   Chloride 106 98 - 111 mmol/L   CO2 22 22 - 32 mmol/L   Glucose, Bld 114 (H) 70 - 99 mg/dL    Comment: Glucose reference range applies only to samples taken after fasting for at least 8 hours.   BUN 9 6 - 20 mg/dL   Creatinine, Ser 6.21 0.44 - 1.00 mg/dL   Calcium 9.2 8.9 - 30.8 mg/dL   Total Protein 8.4 (H) 6.5 - 8.1 g/dL   Albumin 3.9 3.5 - 5.0 g/dL   AST 21 15 - 41 U/L   ALT 12 0 - 44 U/L   Alkaline Phosphatase 72 38 - 126 U/L   Total Bilirubin 0.5 0.3 - 1.2 mg/dL   GFR, Estimated >65 >78 mL/min  Comment: (NOTE) Calculated using the CKD-EPI Creatinine Equation (2021)    Anion gap 9 5 - 15    Comment: Performed at [facility], [address] [place], [place] [address]  Urinalysis, Complete w Microscopic Urine, Catheterized     Status: Abnormal   Collection Time: 03/22/2022  4:45 PM  Result Value Ref Range   Color, Urine STRAW (A) YELLOW   APPearance HAZY (A) CLEAR   Specific Gravity, Urine 1.012 1.005 - 1.030   pH 8.0 5.0 - 8.0   Glucose, UA NEGATIVE NEGATIVE mg/dL   Hgb urine dipstick NEGATIVE NEGATIVE   Bilirubin Urine NEGATIVE NEGATIVE   Ketones, ur NEGATIVE NEGATIVE mg/dL   Protein, ur 299 (A) NEGATIVE mg/dL   Nitrite NEGATIVE NEGATIVE   Leukocytes,Ua NEGATIVE NEGATIVE   RBC / HPF 0-5 0 - 5 RBC/hpf   WBC, UA 0-5 0 - 5 WBC/hpf   Bacteria, UA RARE (A) NONE SEEN   Squamous Epithelial / LPF 0-5 0 - 5   Mucus PRESENT     Comment: Performed at [facility], [address], [place], [place] [address]   Ethanol     Status: None   Collection Time: 03/12/2022  4:45 PM  Result Value Ref Range   Alcohol, Ethyl (B) <10 <10 mg/dL    Comment: (NOTE) Lowest detectable limit for serum alcohol is 10 mg/dL.  For medical purposes only. Performed at [facility], [address] [place], [place] [address]   Urine Drug Screen, Qualitative [facility] only)     Status: None   Collection Time: 03/09/2022  4:45 PM  Result Value Ref Range   Tricyclic, Ur Screen NONE DETECTED NONE DETECTED   Amphetamines, Ur Screen NONE DETECTED NONE DETECTED   MDMA (Ecstasy)Ur Screen NONE DETECTED NONE DETECTED   Cocaine Metabolite,Ur [place] NONE DETECTED NONE DETECTED   Opiate, Ur Screen NONE DETECTED NONE DETECTED   Phencyclidine (PCP) Ur S NONE DETECTED NONE DETECTED   Cannabinoid 50 Ng, Ur [place] NONE DETECTED NONE DETECTED   Barbiturates, Ur Screen NONE DETECTED NONE DETECTED   Benzodiazepine, Ur Scrn NONE DETECTED NONE DETECTED   Methadone Scn, Ur NONE DETECTED NONE DETECTED    Comment: (NOTE) Tricyclics + metabolites, urine    Cutoff 1000 ng/mL Amphetamines + metabolites, urine  Cutoff 1000 ng/mL MDMA (Ecstasy), urine              Cutoff 500 ng/mL Cocaine Metabolite, urine          Cutoff 300 ng/mL Opiate + metabolites, urine        Cutoff 300 ng/mL Phencyclidine (PCP), urine         Cutoff 25 ng/mL Cannabinoid, urine                 Cutoff 50 ng/mL Barbiturates + metabolites, urine  Cutoff 200 ng/mL Benzodiazepine, urine              Cutoff 200 ng/mL Methadone, urine                   Cutoff 300 ng/mL  The urine drug screen provides only a preliminary, unconfirmed analytical test result and should not be used for non-medical purposes. Clinical consideration and professional judgment should be applied to any positive drug screen result due to possible interfering substances. A more specific alternate chemical method must be used in order to obtain a confirmed analytical result. Gas chromatography / mass  spectrometry (GC/MS) is the preferred confirm atory method. Performed at  [facility], [address] [place], [place] [address]   Pregnancy, urine     Status: None   Collection Time: 03/28/2022  4:45 PM  Result Value Ref Range   Preg Test, Ur NEGATIVE NEGATIVE    Comment: Performed at [facility], [address] [place], [place] [address]  Blood gas, arterial     Status: None   Collection Time: 03/04/2022  5:32 PM  Result Value Ref Range   FIO2 40 %   Mode ASSIST CONTROL    MECHVT 500 mL   PEEP 5 cm H20   pH, Arterial 7.37 7.35 - 7.45   pCO2 arterial 40 32 - 48 mmHg   pO2, Arterial 87 83 - 108 mmHg   Bicarbonate 23.1 20.0 - 28.0 mmol/L   Acid-base deficit 2.0 0.0 - 2.0 mmol/L   O2 Saturation 97.5 %   Patient temperature 37.0    Collection site RIGHT RADIAL    Allens test (pass/fail) PASS PASS   Mechanical Rate 16     Comment: Performed at [facility], [address] [place], [place] [address]    [facility]  Result Date: 03/13/2022 CLINICAL DATA:  Intracranial hemorrhage. EXAM: CT ANGIOGRAPHY HEAD AND NECK TECHNIQUE: Multidetector CT imaging of the head and neck was performed using the standard protocol during bolus administration of intravenous contrast. Multiplanar CT image reconstructions and MIPs were obtained to evaluate the vascular anatomy. Carotid stenosis measurements (when applicable) are obtained utilizing NASCET criteria, using the distal internal carotid diameter as the denominator. RADIATION DOSE REDUCTION: This exam was performed according to the departmental dose-optimization program which includes automated exposure control, adjustment of the mA and/or kV according to patient size and/or use of iterative reconstruction technique. CONTRAST:  75mL OMNIPAQUE IOHEXOL 350 MG/ML SOLN COMPARISON:  CT head without contrast 03/23/2022 FINDINGS: CTA NECK FINDINGS Aortic arch: Common origin of the left common carotid artery and  innominate artery noted. Significant atherosclerotic calcification, stenosis or aneurysm is present. Right carotid system: The right common carotid artery is within normal limits. Bifurcation is unremarkable. The cervical right ICA is normal. Left carotid system: The left common carotid artery is within normal limits. Bifurcation is unremarkable. Cervical left ICA is normal. Vertebral arteries: The vertebral arteries are codominant. Both vertebral arteries originate from the subclavian arteries without significant stenosis. No significant stenosis is present in either vertebral artery in the neck. Skeleton: Vertebral body heights and alignment are normal. No focal osseous lesions are present. Other neck: Patient is intubated. Endotracheal tube terminates above the carina. NG tube is in place. Upper chest: Patchy airspace opacity is present in the posterior right upper lobe. Thoracic inlet is within normal limits. Review of the MIP images confirms the above findings CTA HEAD FINDINGS Anterior circulation: The internal carotid arteries are within normal limits from the skull base to the ICA termini. The A1 and M1 segments are normal. A 4 mm aneurysm extends superiorly from the left MCA bifurcation. This is situated just below the hemorrhage. MCA branch vessels are within normal limits bilaterally. The ACA branch vessels are unremarkable Posterior circulation: Vertebral arteries are codominant. PICA origins are visualized and normal. Vertebrobasilar junction and basilar artery are normal. Both posterior cerebral arteries originate from basilar tip. The PCA branch vessels are within normal limits. Venous sinuses: The dural sinuses are patent. The straight sinus deep cerebral veins are intact. Cortical veins are within normal limits. No significant vascular malformation is evident. Anatomic variants: None  Review of the MIP images confirms the above findings IMPRESSION: 1. 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 2. No  other significant proximal stenosis, aneurysm, or branch vessel occlusion within the Circle of Willis. 3. Normal CTA of the neck. 4. Patchy airspace opacity in the posterior right upper lobe may represent atelectasis or pneumonia. These results were called by telephone at the time of interpretation on 03/02/2022 at 6:39 pm to provider Dr. Barbaraann Barthel, who verbally acknowledged these results. Electronically Signed   By: Marin Roberts M.D.   On: 03/13/2022 18:40   CT HEAD WO CONTRAST ( )  Addendum Date: 03/07/2022   ADDENDUM REPORT: 03/28/2022 18:10 ADDENDUM: Hyperintensity in the subarachnoid spaces was noted the time of interpretation. At there is felt to represent pseudo subarachnoid hemorrhage. The case was discussed with Dr. Mikal Plane. Hyperintensity is present in the interpeduncular notch in subarachnoid hemorrhage is not excluded. CTA head and neck and MR head will be ordered. Electronically Signed   By: Marin Roberts M.D.   On: 03/17/2022 18:10   Result Date: 03/02/2022 CLINICAL DATA:  Mental status change. Patient found unresponsive. Hypertension. EXAM: CT HEAD WITHOUT CONTRAST TECHNIQUE: Contiguous axial images were obtained from the base of the skull through the vertex without intravenous contrast. RADIATION DOSE REDUCTION: This exam was performed according to the departmental dose-optimization program which includes automated exposure control, adjustment of the mA and/or kV according to patient size and/or use of iterative reconstruction technique. COMPARISON:  CT head without contrast 10/07/2017 FINDINGS: Brain: Focal parenchymal hemorrhage is present in the anterior inferior left frontal lobe measuring 4.1 x 2.1 x 2.5 cm. Surrounding edema is present. This results in mass effect on the left lateral ventricle with mild subfalcine herniation. No hydrocephalus. Ventricles are somewhat compressed. No other focal lesions are present. Vascular: No hyperdense vessel or unexpected  calcification. Skull: Calvarium is intact. No focal lytic or blastic lesions are present. No significant extracranial soft tissue lesion is present. Sinuses/Orbits: The paranasal sinuses and mastoid air cells are clear. The globes and orbits are within normal limits. IMPRESSION: 1. Focal parenchymal hemorrhage in the anterior inferior left frontal lobe measuring 4.1 x 2.1 x 2.5 cm. Surrounding edema is present. 2. This results in mass effect on the left lateral ventricle with mild subfalcine herniation. 3. No hydrocephalus. Critical Value/emergent results were called by telephone at the time of interpretation on 03/17/2022 at 5:15 pm to provider [facility] , who verbally acknowledged these results. Electronically Signed: By: Marin Roberts M.D. On: 03/07/2022 17:16   DG Chest Port 1 View  Result Date: 03/12/2022 CLINICAL DATA:  Respiratory failure EXAM: PORTABLE CHEST 1 VIEW COMPARISON:  01/08/2018. FINDINGS: Endotracheal tube is seen 3.5 cm above the carina. Nasogastric tube tip is seen within the gastric fundus. Lung volumes are small and there is right basilar atelectasis. No confluent pulmonary infiltrate. No pneumothorax or pleural effusion. Cardiac size within normal limits. No acute bone abnormality IMPRESSION: 1. Support tubes in appropriate position. 2. Pulmonary hypoinflation. Electronically Signed   By: Helyn Numbers M.D.   On: 03/22/2022 17:43    Review of Systems  Unable to perform ROS: Intubated   SpO2 100 %. Physical Exam Constitutional:      General: She is in acute distress.  Neurological:     Mental Status: She is unresponsive.     Comments: Left pupil dilated, not reactive to light. Movement greater on left v right side +cough Not localizing Moderate response to noxious stimuli  Assessment/Plan: Will order a Stat head CT to see if the change in the exam is reflected by changes in her scan. Bp is controlled. Will start standard measures for [facility].   Coletta Memos 03/13/2022, 9:29 PM

## 2022-03-18 NOTE — ED Triage Notes (Signed)
Pt comes ems from home. Pt was in the bed and nephew came over to see her and found her "not responsive." Pt refused to speak to ems at all during transport or to family and family states this is not normal. BP very high at 210 palp. When ems tried again, pt started ripping things off. Pt still never answered. When moved over to stretcher pt began profusely vomiting.

## 2022-03-18 NOTE — ED Notes (Signed)
Report called to RN Tresa Endo, will call RN Marcy Salvo at [phone] after 7pm

## 2022-03-18 NOTE — ED Notes (Signed)
EMTALA reviewed by this RN.  

## 2022-03-18 NOTE — Progress Notes (Addendum)
Patient ID: Rhonda Delgado, female   DOB: 20-Jan-1981, 41 y.o.   MRN: [id number] Wt 117.3 kg   SpO2 100%   BMI 41.74 kg/m  Intubated and sedated. Left pupil now small, minimally reactive, round and regular.  Will schedule for AM OR Repeat scan reviewed. No herniation, clot with mild evolution. No hydrocephalus

## 2022-03-18 NOTE — H&P (Signed)
NAME:  Rhonda Delgado, MRN:  [id number], DOB:  1981/07/16, LOS: 0 ADMISSION DATE:  2022/03/29, CONSULTATION DATE:  03-29-22 REFERRING MD:  [facility] ED, CHIEF COMPLAINT:   ICH  History of Present Illness:  Rhonda Delgado is a 41 y.o. F with PMH of COPD and HTN who presents with L frontal ICH.   She was found with altered mental status by nephew, per family was acting normally a few hours before-hand.  She was taken to the ER at [facility] where she was actively vomiting and very hypertensive, not following commands.  CTH showed focal parenchymal hemorrhage in the anterior inferior left frontal lobe measuring 4.1 x 2.1 x 2.5 cm.  Pt was intubated and blood pressure treated.  CTA showed 4 mm aneurysm at the left MCA bifurcation.   After discussion with neurosurgery, she was transported to [facility].  On arrival noted to have blown L pupil.  She was seen by neurosurgery and stat repeat head CT ordered.  Plan to to go to the OR in the morning for aneurysm clipping.   Pertinent  Medical History   has a past medical history of Asthma, COPD (chronic obstructive pulmonary disease) (HCC), and Hypertension.   Significant Hospital Events: Including procedures, antibiotic start and stop dates in addition to other pertinent events   6/18 transferred from [facility] to [facility] for L frontal ICH, intubated on   Interim History / Subjective:  L pupil initially dilated 45mm and non-reactive, on re-examination improved and now 69mm reactive and equal with R  Objective   Weight 117.3 kg, SpO2 100 %.    Vent Mode: PRVC FiO2 (%):  [40 %-60 %] 60 % Set Rate:  [16 bmp] 16 bmp Vt Set:  [470 mL-500 mL] 470 mL PEEP:  [5 cmH20] 5 cmH20  No intake or output data in the 24 hours ending Mar 29, 2022 2134 Filed Weights   29-Mar-2022 2100  Weight: 117.3 kg    General:  obese, critically ill F, intubated and sedated HEENT: MM pink/moist, L pupil initially 35mm fixed and dilated, R 47mm and responsive to light Neuro: unresponsive on propofol and  versed, pupils as above CV: s1s2 rrr, no m/r/g PULM:  mechanical breath sounds bilaterally, on minimal vent settings with equal chest rise and no wheezing or rhonchi GI: soft, bsx4 active  Extremities: warm/dry,no edema  Skin: no rashes or lesions   Resolved Hospital Problem list     Assessment & Plan:   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 Hypertensive Emergency  Per family has not been taking home anti-hypertensive medication -transferred from [facility] to [facility], neurosurgery as evaluated and plan to take to OR in the morning for aneurysm clipping -continue Nicardipine for SBP <140 -supportive care, target euglycemia and normothermia -serial neuro checks  -Fentanyl and propofol    Acute respiratory insufficiency and aspiration pneumonia Secondary to the above -Unasyn and repeat CXR in AM -Maintain full vent support with SAT/SBT as tolerated -titrate Vent setting to maintain SpO2 greater than or equal to 90%. -HOB elevated 30 degrees. -Plateau pressures less than 30 cm H20.  -Follow chest x-ray, ABG prn.   -Bronchial hygiene and RT/bronchodilator protocol.   Hypokalemia -monitor and replete prn, check mag  History of Asthma, possible COPD and tobacco use -prn albuterol, no home inhalers   Best Practice (right click and "Reselect all SmartList Selections" daily)   Diet/type: NPO DVT prophylaxis: SCD GI prophylaxis: PPI Lines: N/A Foley:  Yes, and it is still needed Code Status:  full code Last date of multidisciplinary goals of care discussion [6/18 Sister Selena Batten updated at the beside]  Labs   CBC: Recent Labs  Lab 03-29-2022 1645  WBC 7.8  HGB 10.2*  HCT 36.0  MCV 77.3*  PLT 340    Basic Metabolic Panel: Recent Labs  Lab 03/29/22 1645  NA 137  K 3.4*  CL 106  CO2 22  GLUCOSE 114*  BUN 9  CREATININE 0.76  CALCIUM 9.2   GFR: Estimated Creatinine Clearance: 121.7 mL/min (by C-G formula based on SCr of 0.76 mg/dL). Recent Labs  Lab 03/29/22 1645   WBC 7.8    Liver Function Tests: Recent Labs  Lab 2022-03-29 1645  AST 21  ALT 12  ALKPHOS 72  BILITOT 0.5  PROT 8.4*  ALBUMIN 3.9   No results for input(s): "LIPASE", "AMYLASE" in the last 168 hours. No results for input(s): "AMMONIA" in the last 168 hours.  ABG    Component Value Date/Time   PHART 7.37 2022/03/29 1732   PCO2ART 40 2022/03/29 1732   PO2ART 87 03/29/22 1732   HCO3 23.1 03/29/22 1732   ACIDBASEDEF 2.0 2022/03/29 1732   O2SAT 97.5 29-Mar-2022 1732     Coagulation Profile: No results for input(s): "INR", "PROTIME" in the last 168 hours.  Cardiac Enzymes: No results for input(s): "CKTOTAL", "CKMB", "CKMBINDEX", "TROPONINI" in the last 168 hours.  HbA1C: No results found for: "HGBA1C"  CBG: No results for input(s): "GLUCAP" in the last 168 hours.  Review of Systems:   Unable to obtain  Past Medical History:  She,  has a past medical history of Asthma, COPD (chronic obstructive pulmonary disease) (HCC), and Hypertension.   Surgical History:   Past Surgical History:  Procedure Laterality Date   denies       Social History:   reports that she has been smoking cigarettes and cigars. She has a 20.00 pack-year smoking history. She has never used smokeless tobacco. She reports current alcohol use of about 2.0 standard drinks of alcohol per week. She reports that she does not currently use drugs after having used the following drugs: Marijuana.   Family History:  Her family history includes Alcohol abuse in her father and paternal grandmother; Asthma in her sister; Breast cancer in her paternal aunt; Cancer in her father; Heart disease in her mother; Hypertension in her mother.   Allergies Allergies  Allergen Reactions   Eggs Or Egg-Derived Products Other (See Comments)    Abdominal cramping      Home Medications  Prior to Admission medications   Medication Sig Start Date End Date Taking? Authorizing Provider  losartan-hydrochlorothiazide  (HYZAAR) 50-12.5 MG tablet Take 1 tablet by mouth daily. Patient not taking: Reported on 11/15/2021 08/03/20   Linzie Collin, MD  metoprolol succinate (TOPROL XL) 50 MG 24 hr tablet Take 1 tablet (50 mg total) by mouth daily. Take with or immediately following a meal. Patient not taking: Reported on 11/15/2021 08/17/20   Linzie Collin, MD     Critical care time: 50 minutes      CRITICAL CARE Performed by: Darcella Gasman Randy Castrejon   Total critical care time: 50 minutes  Critical care time was exclusive of separately billable procedures and treating other patients.  Critical care was necessary to treat or prevent imminent or life-threatening deterioration.  Critical care was time spent personally by me on the following activities: development of treatment plan with patient and/or surrogate as well as nursing, discussions with consultants, evaluation of patient's  response to treatment, examination of patient, obtaining history from patient or surrogate, ordering and performing treatments and interventions, ordering and review of laboratory studies, ordering and review of radiographic studies, pulse oximetry and re-evaluation of patient's condition.   Otilio Carpen Angeligue Bowne, PA-C [facility] See Amion for pager If no response to pager , please call [phone] until 7pm After 7:00 pm call Elink  [phone]?[place]

## 2022-03-18 NOTE — Progress Notes (Signed)
eLink Physician-Brief Progress Note Patient Name: Rhonda Delgado DOB: 07/16/1981 MRN: [id number]   Date of Service  03/30/2022  HPI/Events of Note  41/F with hx of COPD and hypertension presenting with L frontal ICH.  She was noted to have SBP in the 200s and started on nicardipine gtt.  Pt was intubated for airway protection as she had bouts of vomiting.   CTA showed 46mm aneurysm at the left MCA bifurcation.   Normal CTA of the neck.  There is patchy airspace opacity in the RUL.  BP 118/69, HR 78, RR 16, O2 sats 100%.  Pt is intubated.   eICU Interventions  ICH/SAH from aneurysmal bleed Hypertensive emergency Acute respiratory failure Aspiration pneumonia  Neurosurgery has evaluated the patient.  Repeat CT ordered.  BP monitoring to keep SBP <140.  Continue sedation and vent.  Continue empiric antibitics.  SCDs for DVT prophylaxis.  Protonix for GI prophylaxis.      Intervention Category Evaluation Type: New Patient Evaluation  Larinda Buttery 03/24/2022, 9:38 PM

## 2022-03-18 NOTE — ED Notes (Signed)
Pt back from ct, with RN and RT, pt remains on monitor, family at bedside.  Pt satting 99% on vent, NSR

## 2022-03-18 NOTE — ED Notes (Signed)
One ear ring was received from ct, one ear ring was given to night rn [place]. RN [place] at bedside.

## 2022-03-18 NOTE — ED Notes (Signed)
Multiple sticks for culture sets unsuccessful.

## 2022-03-18 NOTE — Progress Notes (Signed)
Pharmacy Antibiotic Note  Rhonda Delgado is a 41 y.o. female admitted on 03/03/2022 with  asp pna .  Pharmacy has been consulted for Unasyn dosing.  Plan: Unasyn 3gm IV q6h  Will f/u renal function, micro data, and pt's clinical condition  Weight: 117.3 kg (258 lb 9.6 oz)  Temp (24hrs), Avg:97.9 F (36.6 C), Min:97.1 F (36.2 C), Max:98.6 F (37 C)  Recent Labs  Lab 03/27/2022 1645  WBC 7.8  CREATININE 0.76    Estimated Creatinine Clearance: 121.7 mL/min (by C-G formula based on SCr of 0.76 mg/dL).    Allergies  Allergen Reactions   Eggs Or Egg-Derived Products Other (See Comments)    Abdominal cramping     Antimicrobials this admission: 6/18 Unasyn >>   Microbiology results: Pending  Thank you for allowing pharmacy to be a part of this patient's care.  Christoper Fabian, PharmD, BCPS Please see amion for complete clinical pharmacist phone list 03/21/2022 10:42 PM

## 2022-03-18 NOTE — H&P (Addendum)
Attending attestation.  Seen with NP. Here after being found down by family member. Workup in [facility] ER revealing likely L MCA aneurysmal bleed.  Intubated for airway protection as she began vomiting question of aspiration. On exam, dilated left pupil not documented at [facility].  Withdraws L>R. Triggers vent.  A: IPH/SAH from aneurysmal bleed Hypertensive crisis Aspiration pneumonia with acute hypoxemic respiratory failure requiring mechanical ventilation  P: Usual SAH measures: nimodipine, neuro checks, strict BP control Stat CT head given difference in exam here vs. [facility], Dr. Franky Macho following, appreciate help Vent support with fent/prop titrated to synchrony and RASS -1 Replete K Sister at bedside updated  32 min cc time Myrla Halsted MD PCCM

## 2022-03-18 NOTE — ED Provider Notes (Addendum)
[facility] Provider Note    Event Date/Time   First MD Initiated Contact with Patient 03/22/2022 1635     (approximate)  History   Chief Complaint: Emesis and Altered Mental Status  HPI  Rhonda Delgado is a 42 y.o. female with a past medical history of asthma, COPD, hypertension presents to the emergency department for altered mental status.  According to EMS they were called out by family member for altered mental status while at home.  EMS states they found the patient to be altered in route to the hospital patient vomited several times including upon arrival to the emergency department room.  Patient is awake will pull-away for me will pull away from IV attempts we will attempt to take her blood pressure cuff off.  She is holding an emesis bag but actively vomiting on the floor.  EMS reports high blood pressure 210 in route to the hospital.  During my evaluation patient has reactive pupils, pulls away when you open her eyes.  But she is not providing any history.  Physical Exam    Most recent vital signs: There were no vitals filed for this visit.  General: Awake but keeps eyes closed through most of the exam.  Withdraws from discomfort and pain.  Holding an emesis bag but actively vomiting on the floor. CV:  Good peripheral perfusion.  Regular rate and rhythm  Resp:  Normal effort.  Equal breath sounds bilaterally.  Abd:  No distention.  Soft, possible tenderness patient moans when you push but no focal tenderness identified. Other:  Actively vomiting   ED Results / Procedures / Treatments   EKG  EKG viewed and interpreted by myself shows a normal sinus rhythm at 55 bpm with a narrow QRS, normal axis, normal intervals, nonspecific ST changes.  RADIOLOGY  I have reviewed the CT images patient appears to have a left frontal intraparenchymal bleed.   MEDICATIONS ORDERED IN ED: Medications  ondansetron (ZOFRAN) injection 4 mg (has no administration  in time range)  sodium chloride 0.9 % bolus 1,000 mL (has no administration in time range)     IMPRESSION / MDM / ASSESSMENT AND PLAN / ED COURSE  I reviewed the triage vital signs and the nursing notes.  Patient's presentation is most consistent with acute presentation with potential threat to life or bodily function.  Patient presents emergency department from home with altered mental status nausea and vomiting.  We will dose IV fluids, Zofran we will check labs and obtain CT imaging of the head to rule out ICH.  Differential would also include metabolic or electrolyte abnormality, infectious etiology, gastritis, gastroenteritis.  CT appears to be consistent with intraparenchymal bleed.  Blood pressure is significant elevated 258/126.  We will dose 20 mg of IV labetalol.  We will start the patient on a nicardipine infusion.  We will intubate the patient to protect her airway given her vomiting in the emergency department with large bleed.  We will dose lidocaine prior to intubation.  Awaiting neurosurgery callback.  Patient intubated without issue.  Patient's blood pressure still remains elevated increase nicardipine to 15 mg/h.  We will dose additional 20 mg of IV labetalol.  Patient on propofol for sedation we will increase sedation as the patient is moving at times.  I spoke to Dr. Marcell Barlow of neurosurgery.  I spoke with Dr.Bhagatt of neurology.  We will transfer this patient to [facility].  Neurology will help with the transfer.  Prior to  intubation patient did withdraw from pain had reactive 2 to 3 mm pupils and intact corneal response.  I spoke to our neurologist they are requesting a CTA of the head and neck to evaluate for possible underlying vascular abnormality.  I spoke to [facility], ICU, they have excepted the patient tentatively to their service however they are not sure if they have any available beds they are going to verify and call back.  If the CTA shows vascular  abnormality then I will speak with neurosurgery at [facility], if not then neurology at [facility].  Blood pressures much better controlled currently 128/84.  We are reducing nicardipine titration.  [facility] has confirmed they have a bed available for the patient.  Patient will be transferred after 7 PM per [facility].  CTA does show small aneurysm.  CTA also shows possible pneumonia given the patient vomiting with altered mental status we will cover for aspiration pneumonia with Unasyn and Zithromax.   INTUBATION Performed by: Minna Antis  Required items: required blood products, implants, devices, and special equipment available Patient identity confirmed: provided demographic data and hospital-assigned identification number Time out: Immediately prior to procedure a "time out" was called to verify the correct patient, procedure, equipment, support staff and site/side marked as required.  Indications: Airway protection  Intubation method: S4 Glidescope Laryngoscopy   Preoxygenation: 100% BVM  80 mg lidocaine prior to etomidate Sedatives: 20 mg etomidate Paralytic: 130 mg succinylcholine  Tube Size: 7.0 cuffed  Post-procedure assessment: chest rise and ETCO2 monitor Breath sounds: equal and absent over the epigastrium Tube secured with: ETT holder Chest x-ray interpreted by radiologist and me.  Chest x-ray findings: endotracheal tube in appropriate position  Patient tolerated the procedure well with no immediate complications.   CRITICAL CARE Performed by: Minna Antis   Total critical care time: 60 minutes  Critical care time was exclusive of separately billable procedures and treating other patients.  Critical care was necessary to treat or prevent imminent or life-threatening deterioration.  Critical care was time spent personally by me on the following activities: development of treatment plan with patient and/or surrogate as well as nursing,  discussions with consultants, evaluation of patient's response to treatment, examination of patient, obtaining history from patient or surrogate, ordering and performing treatments and interventions, ordering and review of laboratory studies, ordering and review of radiographic studies, pulse oximetry and re-evaluation of patient's condition.  FINAL CLINICAL IMPRESSION(S) / ED DIAGNOSES   Intracerebral hemorrhage Nausea vomiting Altered mental status   Note:  This document was prepared using Dragon voice recognition software and may include unintentional dictation errors.   Minna Antis, MD 2022/03/21 1815    Minna Antis, MD 21-Mar-2022 1857

## 2022-03-18 NOTE — ED Notes (Signed)
[facility]ed [facility] spoke to [place] for transfer  [phone]

## 2022-03-18 NOTE — ED Notes (Signed)
Inetta Fermo called from [facility] with bed assignment for patient  [id number]  report number [phone]  [facility] with transport after 1900     1815

## 2022-03-19 ENCOUNTER — Encounter (HOSPITAL_COMMUNITY): Payer: Self-pay | Admitting: Internal Medicine

## 2022-03-19 ENCOUNTER — Inpatient Hospital Stay (HOSPITAL_COMMUNITY): Payer: Medicaid Other | Admitting: Certified Registered Nurse Anesthetist

## 2022-03-19 ENCOUNTER — Inpatient Hospital Stay (HOSPITAL_COMMUNITY): Payer: Medicaid Other

## 2022-03-19 ENCOUNTER — Other Ambulatory Visit (HOSPITAL_COMMUNITY): Payer: Medicaid Other

## 2022-03-19 ENCOUNTER — Encounter (HOSPITAL_COMMUNITY): Admission: AD | Disposition: E | Payer: Self-pay | Source: Other Acute Inpatient Hospital | Attending: Neurosurgery

## 2022-03-19 ENCOUNTER — Other Ambulatory Visit: Payer: Self-pay

## 2022-03-19 DIAGNOSIS — I161 Hypertensive emergency: Secondary | ICD-10-CM

## 2022-03-19 DIAGNOSIS — Z794 Long term (current) use of insulin: Secondary | ICD-10-CM

## 2022-03-19 DIAGNOSIS — I614 Nontraumatic intracerebral hemorrhage in cerebellum: Secondary | ICD-10-CM

## 2022-03-19 DIAGNOSIS — I608 Other nontraumatic subarachnoid hemorrhage: Secondary | ICD-10-CM

## 2022-03-19 DIAGNOSIS — S066XAA Traumatic subarachnoid hemorrhage with loss of consciousness status unknown, initial encounter: Secondary | ICD-10-CM

## 2022-03-19 DIAGNOSIS — F17210 Nicotine dependence, cigarettes, uncomplicated: Secondary | ICD-10-CM

## 2022-03-19 DIAGNOSIS — R0689 Other abnormalities of breathing: Secondary | ICD-10-CM

## 2022-03-19 DIAGNOSIS — E119 Type 2 diabetes mellitus without complications: Secondary | ICD-10-CM

## 2022-03-19 DIAGNOSIS — I10 Essential (primary) hypertension: Secondary | ICD-10-CM

## 2022-03-19 DIAGNOSIS — J449 Chronic obstructive pulmonary disease, unspecified: Secondary | ICD-10-CM

## 2022-03-19 HISTORY — PX: CRANIOTOMY: SHX93

## 2022-03-19 LAB — HIV ANTIBODY (ROUTINE TESTING W REFLEX): HIV Screen 4th Generation wRfx: NONREACTIVE

## 2022-03-19 LAB — POCT I-STAT 7, (LYTES, BLD GAS, ICA,H+H)
Acid-base deficit: 3 mmol/L — ABNORMAL HIGH (ref 0.0–2.0)
Acid-base deficit: 4 mmol/L — ABNORMAL HIGH (ref 0.0–2.0)
Acid-base deficit: 7 mmol/L — ABNORMAL HIGH (ref 0.0–2.0)
Bicarbonate: 16.9 mmol/L — ABNORMAL LOW (ref 20.0–28.0)
Bicarbonate: 21.7 mmol/L (ref 20.0–28.0)
Bicarbonate: 23.8 mmol/L (ref 20.0–28.0)
Calcium, Ion: 1.24 mmol/L (ref 1.15–1.40)
Calcium, Ion: 1.24 mmol/L (ref 1.15–1.40)
Calcium, Ion: 1.28 mmol/L (ref 1.15–1.40)
HCT: 29 % — ABNORMAL LOW (ref 36.0–46.0)
HCT: 30 % — ABNORMAL LOW (ref 36.0–46.0)
HCT: 33 % — ABNORMAL LOW (ref 36.0–46.0)
Hemoglobin: 10.2 g/dL — ABNORMAL LOW (ref 12.0–15.0)
Hemoglobin: 11.2 g/dL — ABNORMAL LOW (ref 12.0–15.0)
Hemoglobin: 9.9 g/dL — ABNORMAL LOW (ref 12.0–15.0)
O2 Saturation: 100 %
O2 Saturation: 95 %
O2 Saturation: 96 %
Patient temperature: 37.2
Patient temperature: 98.6
Potassium: 3.3 mmol/L — ABNORMAL LOW (ref 3.5–5.1)
Potassium: 3.8 mmol/L (ref 3.5–5.1)
Potassium: 4.1 mmol/L (ref 3.5–5.1)
Sodium: 138 mmol/L (ref 135–145)
Sodium: 139 mmol/L (ref 135–145)
Sodium: 141 mmol/L (ref 135–145)
TCO2: 18 mmol/L — ABNORMAL LOW (ref 22–32)
TCO2: 23 mmol/L (ref 22–32)
TCO2: 25 mmol/L (ref 22–32)
pCO2 arterial: 28.6 mmHg — ABNORMAL LOW (ref 32–48)
pCO2 arterial: 40.2 mmHg (ref 32–48)
pCO2 arterial: 47.3 mmHg (ref 32–48)
pH, Arterial: 7.309 — ABNORMAL LOW (ref 7.35–7.45)
pH, Arterial: 7.341 — ABNORMAL LOW (ref 7.35–7.45)
pH, Arterial: 7.38 (ref 7.35–7.45)
pO2, Arterial: 190 mmHg — ABNORMAL HIGH (ref 83–108)
pO2, Arterial: 78 mmHg — ABNORMAL LOW (ref 83–108)
pO2, Arterial: 81 mmHg — ABNORMAL LOW (ref 83–108)

## 2022-03-19 LAB — CBC
HCT: 33.2 % — ABNORMAL LOW (ref 36.0–46.0)
Hemoglobin: 9.6 g/dL — ABNORMAL LOW (ref 12.0–15.0)
MCH: 22.6 pg — ABNORMAL LOW (ref 26.0–34.0)
MCHC: 28.9 g/dL — ABNORMAL LOW (ref 30.0–36.0)
MCV: 78.3 fL — ABNORMAL LOW (ref 80.0–100.0)
Platelets: 314 10*3/uL (ref 150–400)
RBC: 4.24 MIL/uL (ref 3.87–5.11)
RDW: 19 % — ABNORMAL HIGH (ref 11.5–15.5)
WBC: 13.5 10*3/uL — ABNORMAL HIGH (ref 4.0–10.5)
nRBC: 0 % (ref 0.0–0.2)

## 2022-03-19 LAB — PHOSPHORUS: Phosphorus: 3.3 mg/dL (ref 2.5–4.6)

## 2022-03-19 LAB — BASIC METABOLIC PANEL
Anion gap: 7 (ref 5–15)
BUN: 6 mg/dL (ref 6–20)
CO2: 21 mmol/L — ABNORMAL LOW (ref 22–32)
Calcium: 8.7 mg/dL — ABNORMAL LOW (ref 8.9–10.3)
Chloride: 106 mmol/L (ref 98–111)
Creatinine, Ser: 0.74 mg/dL (ref 0.44–1.00)
GFR, Estimated: >60 mL/min (ref >60)
Glucose, Bld: 116 mg/dL — ABNORMAL HIGH (ref 70–99)
Potassium: 4.1 mmol/L (ref 3.5–5.1)
Sodium: 134 mmol/L — ABNORMAL LOW (ref 135–145)

## 2022-03-19 LAB — ABO/RH: ABO/RH(D): A POS

## 2022-03-19 LAB — GLUCOSE, CAPILLARY
Glucose-Capillary: 106 mg/dL — ABNORMAL HIGH (ref 70–99)
Glucose-Capillary: 135 mg/dL — ABNORMAL HIGH (ref 70–99)
Glucose-Capillary: 149 mg/dL — ABNORMAL HIGH (ref 70–99)
Glucose-Capillary: 190 mg/dL — ABNORMAL HIGH (ref 70–99)

## 2022-03-19 LAB — MAGNESIUM: Magnesium: 1.9 mg/dL (ref 1.7–2.4)

## 2022-03-19 LAB — TRIGLYCERIDES: Triglycerides: 87 mg/dL (ref <150)

## 2022-03-19 LAB — HEMOGLOBIN A1C
Hgb A1c MFr Bld: 5.3 % (ref 4.8–5.6)
Mean Plasma Glucose: 105.41 mg/dL

## 2022-03-19 LAB — PREPARE RBC (CROSSMATCH)

## 2022-03-19 SURGERY — CRANIOTOMY, FOR VERTEBRAL/BASILAR ARTERY ANEURYSM REPAIR
Anesthesia: General | Site: Head | Laterality: Left

## 2022-03-19 MED ORDER — PROPOFOL 10 MG/ML IV BOLUS
INTRAVENOUS | Status: AC
  Filled 2022-03-19: qty 20

## 2022-03-19 MED ORDER — FENTANYL CITRATE (PF) 250 MCG/5ML IJ SOLN
INTRAMUSCULAR | Status: AC
  Filled 2022-03-19: qty 5

## 2022-03-19 MED ORDER — BACITRACIN ZINC 500 UNIT/GM EX OINT
TOPICAL_OINTMENT | CUTANEOUS | Status: DC | PRN
  Administered 2022-03-19: 1 via TOPICAL

## 2022-03-19 MED ORDER — BACITRACIN ZINC 500 UNIT/GM EX OINT
TOPICAL_OINTMENT | CUTANEOUS | Status: AC
  Filled 2022-03-19: qty 28.35

## 2022-03-19 MED ORDER — HYDRALAZINE HCL 20 MG/ML IJ SOLN
INTRAMUSCULAR | Status: DC | PRN
  Administered 2022-03-19: 20 mg via INTRAVENOUS

## 2022-03-19 MED ORDER — PANTOPRAZOLE 2 MG/ML SUSPENSION
40.0000 mg | Freq: Every day | ORAL | Status: DC
  Administered 2022-03-20 – 2022-03-31 (×12): 40 mg
  Filled 2022-03-19 (×12): qty 20

## 2022-03-19 MED ORDER — INDOCYANINE GREEN 25 MG IV SOLR
INTRAVENOUS | Status: AC
Start: 2022-03-19 — End: ?
  Filled 2022-03-19: qty 10

## 2022-03-19 MED ORDER — LIDOCAINE 2% (20 MG/ML) 5 ML SYRINGE
INTRAMUSCULAR | Status: DC | PRN
  Administered 2022-03-19: 80 mg via INTRAVENOUS

## 2022-03-19 MED ORDER — THROMBIN 5000 UNITS EX SOLR
CUTANEOUS | Status: AC
  Filled 2022-03-19: qty 5000

## 2022-03-19 MED ORDER — ESMOLOL HCL 100 MG/10ML IV SOLN
INTRAVENOUS | Status: DC | PRN
  Administered 2022-03-19: 60 mg via INTRAVENOUS

## 2022-03-19 MED ORDER — METOPROLOL TARTRATE 5 MG/5ML IV SOLN
INTRAVENOUS | Status: DC | PRN
  Administered 2022-03-19 (×2): 2.5 mg via INTRAVENOUS

## 2022-03-19 MED ORDER — ONDANSETRON HCL 4 MG/2ML IJ SOLN
INTRAMUSCULAR | Status: DC | PRN
  Administered 2022-03-19: 4 mg via INTRAVENOUS

## 2022-03-19 MED ORDER — ORAL CARE MOUTH RINSE: 15.0000 mL | Freq: Once | OROMUCOSAL | Status: AC

## 2022-03-19 MED ORDER — PAPAVERINE HCL 30 MG/ML IJ SOLN
INTRAMUSCULAR | Status: DC | PRN
  Administered 2022-03-19: 60 mg via INTRAVENOUS

## 2022-03-19 MED ORDER — CHLORHEXIDINE GLUCONATE CLOTH 2 % EX PADS
6.0000 | MEDICATED_PAD | Freq: Every day | CUTANEOUS | Status: DC
Start: 2022-03-19 — End: 2022-04-01
  Administered 2022-03-19 – 2022-03-31 (×13): 6 via TOPICAL

## 2022-03-19 MED ORDER — 0.9 % SODIUM CHLORIDE (POUR BTL) OPTIME
TOPICAL | Status: DC | PRN
  Administered 2022-03-19: 3000 mL

## 2022-03-19 MED ORDER — HYDROMORPHONE HCL 1 MG/ML IJ SOLN
0.5000 mg | INTRAMUSCULAR | Status: DC | PRN
  Administered 2022-03-20 (×2): 0.5 mg via INTRAVENOUS
  Filled 2022-03-19 (×2): qty 1

## 2022-03-19 MED ORDER — SODIUM CHLORIDE 0.9 % IV SOLN: INTRAVENOUS | Status: DC | PRN

## 2022-03-19 MED ORDER — SODIUM CHLORIDE 0.9 % IV SOLN: INTRAVENOUS | Status: DC

## 2022-03-19 MED ORDER — SODIUM CHLORIDE 0.9% IV SOLUTION: Freq: Once | INTRAVENOUS | Status: AC

## 2022-03-19 MED ORDER — LABETALOL HCL 5 MG/ML IV SOLN
INTRAVENOUS | Status: DC | PRN
  Administered 2022-03-19: 5 mg via INTRAVENOUS
  Administered 2022-03-19: 20 mg via INTRAVENOUS
  Administered 2022-03-19: 5 mg via INTRAVENOUS
  Administered 2022-03-19: 10 mg via INTRAVENOUS

## 2022-03-19 MED ORDER — LEVETIRACETAM IN NACL 1000 MG/100ML IV SOLN
1000.0000 mg | INTRAVENOUS | Status: AC
Start: 2022-03-19 — End: 2022-03-19
  Administered 2022-03-19: 1000 mg via INTRAVENOUS
  Filled 2022-03-19: qty 100

## 2022-03-19 MED ORDER — THROMBIN 20000 UNITS EX SOLR
CUTANEOUS | Status: AC
  Filled 2022-03-19: qty 20000

## 2022-03-19 MED ORDER — SUCCINYLCHOLINE CHLORIDE 200 MG/10ML IV SOSY
PREFILLED_SYRINGE | INTRAVENOUS | Status: DC | PRN
  Administered 2022-03-19: 180 mg via INTRAVENOUS

## 2022-03-19 MED ORDER — PAPAVERINE HCL 30 MG/ML IJ SOLN
60.0000 mg | Freq: Once | INTRAMUSCULAR | Status: DC
  Filled 2022-03-19: qty 2

## 2022-03-19 MED ORDER — DEXAMETHASONE SODIUM PHOSPHATE 10 MG/ML IJ SOLN
INTRAMUSCULAR | Status: DC | PRN
  Administered 2022-03-19: 10 mg via INTRAVENOUS

## 2022-03-19 MED ORDER — SODIUM CHLORIDE 0.9 % IV SOLN
0.1500 ug/kg/min | INTRAVENOUS | Status: AC
  Administered 2022-03-19: .2 ug/kg/min via INTRAVENOUS
  Filled 2022-03-19: qty 5000

## 2022-03-19 MED ORDER — CLEVIDIPINE BUTYRATE 0.5 MG/ML IV EMUL
INTRAVENOUS | Status: AC
  Filled 2022-03-19: qty 50

## 2022-03-19 MED ORDER — SUGAMMADEX SODIUM 200 MG/2ML IV SOLN
INTRAVENOUS | Status: DC | PRN
  Administered 2022-03-19: 400 mg via INTRAVENOUS

## 2022-03-19 MED ORDER — LIDOCAINE HCL 0.5 % IJ SOLN
INTRAMUSCULAR | Status: DC | PRN
  Administered 2022-03-19: 15 mL

## 2022-03-19 MED ORDER — LABETALOL HCL 5 MG/ML IV SOLN
10.0000 mg | INTRAVENOUS | Status: DC | PRN
  Administered 2022-03-19 – 2022-03-22 (×8): 10 mg via INTRAVENOUS
  Filled 2022-03-19 (×10): qty 4

## 2022-03-19 MED ORDER — THROMBIN 20000 UNITS EX SOLR: CUTANEOUS | Status: DC | PRN

## 2022-03-19 MED ORDER — CLEVIDIPINE BUTYRATE 0.5 MG/ML IV EMUL
0.0000 mg/h | INTRAVENOUS | Status: DC
  Administered 2022-03-19: 8 mg/h via INTRAVENOUS

## 2022-03-19 MED ORDER — MANNITOL 25 % IV SOLN
INTRAVENOUS | Status: DC | PRN
  Administered 2022-03-19: 50 g via INTRAVENOUS

## 2022-03-19 MED ORDER — THROMBIN 5000 UNITS EX SOLR
CUTANEOUS | Status: AC
Start: 2022-03-19 — End: ?
  Filled 2022-03-19: qty 5000

## 2022-03-19 MED ORDER — SODIUM CHLORIDE 0.9 % IV SOLN
0.0125 ug/kg/min | INTRAVENOUS | Status: DC
  Filled 2022-03-19: qty 2000

## 2022-03-19 MED ORDER — LIDOCAINE HCL (PF) 0.5 % IJ SOLN
INTRAMUSCULAR | Status: AC
Start: 2022-03-19 — End: ?
  Filled 2022-03-19: qty 50

## 2022-03-19 MED ORDER — BUTALBITAL-APAP-CAFFEINE 50-325-40 MG PO TABS
1.0000 | ORAL_TABLET | ORAL | Status: DC | PRN
  Administered 2022-03-20: 1 via NASOGASTRIC
  Filled 2022-03-19: qty 1

## 2022-03-19 MED ORDER — THROMBIN 5000 UNITS EX SOLR: OROMUCOSAL | Status: DC | PRN

## 2022-03-19 MED ORDER — HEMOSTATIC AGENTS (NO CHARGE) OPTIME
TOPICAL | Status: DC | PRN
  Administered 2022-03-19: 1 via TOPICAL

## 2022-03-19 MED ORDER — PROPOFOL 10 MG/ML IV BOLUS
INTRAVENOUS | Status: DC | PRN
  Administered 2022-03-19: 140 mg via INTRAVENOUS
  Administered 2022-03-19: 70 mg via INTRAVENOUS
  Administered 2022-03-19: 30 mg via INTRAVENOUS

## 2022-03-19 MED ORDER — CHLORHEXIDINE GLUCONATE 0.12 % MT SOLN
15.0000 mL | Freq: Once | OROMUCOSAL | Status: AC
Start: 2022-03-19 — End: 2022-03-19
  Administered 2022-03-19: 15 mL via OROMUCOSAL
  Filled 2022-03-19: qty 15

## 2022-03-19 MED ORDER — INDOCYANINE GREEN 25 MG IV SOLR
INTRAVENOUS | Status: DC | PRN
  Administered 2022-03-19: 12.5 mg via INTRAVENOUS

## 2022-03-19 MED ORDER — ROCURONIUM BROMIDE 10 MG/ML (PF) SYRINGE
PREFILLED_SYRINGE | INTRAVENOUS | Status: DC | PRN
  Administered 2022-03-19 (×3): 50 mg via INTRAVENOUS

## 2022-03-19 SURGICAL SUPPLY — 59 items
BAG COUNTER SPONGE SURGICOUNT (BAG) ×3 IMPLANT
BAG SPNG CNTER NS LX DISP (BAG) ×2
BAND INSRT 18 STRL LF DISP RB (MISCELLANEOUS) ×2
BAND RUBBER #18 3X1/16 STRL (MISCELLANEOUS) ×4 IMPLANT
BNDG CMPR 75X41 PLY ABS (GAUZE/BANDAGES/DRESSINGS) ×1
BNDG STRETCH 4X75 NS LF (GAUZE/BANDAGES/DRESSINGS) ×1 IMPLANT
BUR ACORN 6.0 PRECISION (BURR) ×2 IMPLANT
BUR ROUND FLUTED 4 SOFT TCH (BURR) ×1 IMPLANT
BUR SPIRAL ROUTER 2.3 (BUR) ×1 IMPLANT
CANISTER SUCT 3000ML PPV (MISCELLANEOUS) ×4 IMPLANT
CARTRIDGE OIL MAESTRO DRILL (MISCELLANEOUS) ×1 IMPLANT
CLIP ANEURY TI PERM MINI 6.6M (Clip) ×1 IMPLANT
CLIP RANEY DISP (INSTRUMENTS) ×2 IMPLANT
DIFFUSER DRILL AIR PNEUMATIC (MISCELLANEOUS) ×2 IMPLANT
DRAPE MICROSCOPE LEICA (MISCELLANEOUS) ×2 IMPLANT
DRAPE NEUROLOGICAL W/INCISE (DRAPES) ×2 IMPLANT
DRAPE WARM FLUID 44X44 (DRAPES) ×2 IMPLANT
DRSG ADAPTIC 3X8 NADH LF (GAUZE/BANDAGES/DRESSINGS) ×1 IMPLANT
DRSG TELFA 3X8 NADH (GAUZE/BANDAGES/DRESSINGS) ×2 IMPLANT
DURAPREP 6ML APPLICATOR 50/CS (WOUND CARE) ×2 IMPLANT
ELECT REM PT RETURN 9FT ADLT (ELECTROSURGICAL) ×2
ELECTRODE REM PT RTRN 9FT ADLT (ELECTROSURGICAL) ×1 IMPLANT
FORCEPS BIPOLAR SPETZLER 8 1.0 (NEUROSURGERY SUPPLIES) ×1 IMPLANT
GLOVE ECLIPSE 6.5 STRL STRAW (GLOVE) ×2 IMPLANT
GOWN STRL REUS W/ TWL LRG LVL3 (GOWN DISPOSABLE) ×2 IMPLANT
GOWN STRL REUS W/ TWL XL LVL3 (GOWN DISPOSABLE) IMPLANT
GOWN STRL REUS W/TWL 2XL LVL3 (GOWN DISPOSABLE) IMPLANT
GOWN STRL REUS W/TWL LRG LVL3 (GOWN DISPOSABLE) ×4
GOWN STRL REUS W/TWL XL LVL3 (GOWN DISPOSABLE) ×4
HEMOSTAT POWDER KIT SURGIFOAM (HEMOSTASIS) ×2 IMPLANT
HEMOSTAT SURGICEL 2X14 (HEMOSTASIS) ×2 IMPLANT
HOOK DURA 1/2IN (MISCELLANEOUS) ×2 IMPLANT
KIT BASIN OR (CUSTOM PROCEDURE TRAY) ×2 IMPLANT
KIT DRAIN CSF ACCUDRAIN (MISCELLANEOUS) IMPLANT
KIT TURNOVER KIT B (KITS) ×2 IMPLANT
KNIFE ARACHNOID DISP AM-24-S (MISCELLANEOUS) ×1 IMPLANT
NDL HYPO 25X1 1.5 SAFETY (NEEDLE) ×1 IMPLANT
NEEDLE HYPO 25X1 1.5 SAFETY (NEEDLE) ×2 IMPLANT
NS IRRIG 1000ML POUR BTL (IV SOLUTION) ×4 IMPLANT
OIL CARTRIDGE MAESTRO DRILL (MISCELLANEOUS) ×2
PACK BATTERY CMF DISP FOR DVR (ORTHOPEDIC DISPOSABLE SUPPLIES) ×1 IMPLANT
PACK CRANIOTOMY CUSTOM (CUSTOM PROCEDURE TRAY) ×2 IMPLANT
PAD ARMBOARD 7.5X6 YLW CONV (MISCELLANEOUS) ×3 IMPLANT
PAD DRESSING TELFA 3X8 NADH (GAUZE/BANDAGES/DRESSINGS) IMPLANT
PIN MAYFIELD SKULL DISP (PIN) ×1 IMPLANT
PLATE CRANIAL 4H UNI NEURO III (Plate) IMPLANT
PLATE CRANIAL UNIV 8H (Plate) ×1 IMPLANT
PLATE UNIV CMF 16 2H (Plate) ×3 IMPLANT
SCREW UNIII AXS SD 1.5X4 (Screw) ×8 IMPLANT
SPONGE SURGIFOAM ABS GEL 100 (HEMOSTASIS) ×2 IMPLANT
SPONGE SURGIFOAM ABS GEL SZ50 (HEMOSTASIS) ×1 IMPLANT
STAPLER VISISTAT 35W (STAPLE) ×1 IMPLANT
STOCKINETTE TUBULAR 6 INCH (GAUZE/BANDAGES/DRESSINGS) ×1 IMPLANT
SUT NURALON 4 0 TR CR/8 (SUTURE) ×4 IMPLANT
SUT VIC AB 2-0 CT2 18 VCP726D (SUTURE) ×4 IMPLANT
SUT VIC AB 3-0 SH 8-18 (SUTURE) ×1 IMPLANT
TOWEL GREEN STERILE (TOWEL DISPOSABLE) ×2 IMPLANT
TOWEL GREEN STERILE FF (TOWEL DISPOSABLE) ×2 IMPLANT
WATER STERILE IRR 1000ML POUR (IV SOLUTION) ×2 IMPLANT

## 2022-03-19 NOTE — Assessment & Plan Note (Addendum)
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 TCD show no vasospasm but limited study.   - Continue Nimotop  - Daily TCD's.

## 2022-03-19 NOTE — Progress Notes (Signed)
Patient self-extubated at ~0955. No significant coughing, appears able to manage secretions at this time. SpO2 100% on NRB. Not following commands, continues to move LUE to noxious stimuli. Lung fields with bilateral scattered rhonchi, grossly unchanged from prior.  Plan to monitor at this time, given pending OR case early afternoon and reintubation at that time.  Tim Lair, PA-C  Pulmonary & Critical Care 24-Mar-2022 9:59 AM  Please see [email or web address] for pager details.  From 7A-7P if no response, please call [phone] After hours, please call ELink [phone]

## 2022-03-19 NOTE — Assessment & Plan Note (Deleted)
Tolerated self extubation today.  Extubated post-op  Remains lethargic Hypoxic on NRB - suspect atelectasis on CXR  - Continue aggressive chest PT  - Bronchodilators - Trial of diuresis - SBT once oxygenation improves, but mental status will likely drive extubation.

## 2022-03-19 NOTE — Progress Notes (Signed)
  NEUROSURGERY PROGRESS NOTE   No issues overnight, pt self-exutbated this am but appears to be stable. History reviewed over phone with patient's sister and in EMR. Briefly, pt had onset of HA 3 days ago, found down by nephew yesterday. Taken to [facility], intubated en-route to [facility]. Pt has hx of HTN but per sister is not very compliant with medications. She is a current cigarette smoker. No known FHx of intracranial aneurysms of unexplained intracranial hemorrhage.  EXAM:  BP (!) 145/80   Pulse (!) 59   Temp 99.2 F (37.3 C) (Oral)   Resp 19   Wt 119.1 kg   SpO2 100%   BMI 42.38 kg/m   Opens eyes to stimuli Left pupil 15mm, non-reactive Right pupil 46mm, reactive Extubated, breathing spontaneously Minimal cough/gage Localizes LUE No movement LLE/RUE/RLE  IMAGING: CTH reviewed demonstrating diffuse basal SAH and a left frontal/opercular IPH with associated mass effect on the left lateral ventricle but no HCP.  CTA demonstrates an anteriorly projecting aneurysm in association with the frontal hematoma of the LMCA bifurcation measuring ~4-30mm. No other aneurysms noted.  IMPRESSION:  41 y.o. 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 With the location at the MCA bifurcation and the presence of the IPH, and after review of the morphology on CTA, I feel that surgical clipping is the most appropriate treatment option.  PLAN: - Will plan on operative clip ligation via left pterional craniotomy this am - Cont Nimotop - Closely monitor respiratory status s/p extubation - Can get baseline TCD later today or tomorrow   I have reviewed the situation with the patient's sister over the phone. We have discussed the imaging findings thus far. I reviewed with her the indications for treatment of the aneurysm and my recommendation for surgical clipping. We discussed the details of the planned surgery including associated risks of stroke and/or life-threatening bleeding as well as risks of  infection, CSF leak, SZ, and hydrocephalus. All her questions were answered and she provided informed consent to proceed with surgery on her sister's behalf.   Lisbeth Renshaw, MD [facility]

## 2022-03-19 NOTE — Anesthesia Procedure Notes (Signed)
Arterial Line Insertion Start/End6/19/2023 12:00 PM  Patient location: Pre-op. Preanesthetic checklist: patient identified, IV checked, site marked, risks and benefits discussed, surgical consent, monitors and equipment checked, pre-op evaluation, timeout performed and anesthesia consent Lidocaine 1% used for infiltration Right, radial was placed Catheter size: 20 G Hand hygiene performed  and maximum sterile barriers used   Attempts: 1 Procedure performed without using ultrasound guided technique. Following insertion, dressing applied and Biopatch. Post procedure assessment: normal and unchanged  Patient tolerated the procedure well with no immediate complications.

## 2022-03-19 NOTE — Progress Notes (Signed)
Patient self-extubated. Upon entry to room, patient O2 sat was 92. Placed on NRB.  Per Dr. Denese Killings, monitor closely.

## 2022-03-19 NOTE — Progress Notes (Addendum)
NAME:  Rhonda Delgado, MRN:  [id number], DOB:  [birth year], LOS: 1 ADMISSION DATE:  04/07/22, CONSULTATION DATE:  Apr 07, 2022 REFERRING MD:  [facility] EDP CHIEF COMPLAINT:   ICH  History of Present Illness:  41 year old woman who presented to [facility] 6/18 with AMS, vomiting and hypertension. Found to have L frontal ICH. PMHx significant for HTN, asthma/COPD and tobacco abuse.  Per chart review, patient found with AMS by nephew; per family was acting normal a few hours prior to presentation.  Taken [facility] ED where she was actively vomiting, very hypertensive and unable to follow commands.  CT Head showed focal parenchymal hemorrhage in the anterior inferior left frontal lobe measuring 4.1 x 2.1 x 2.5 cm.  Intubated and blood pressure treated.  CTA demonstrated 24mm aneurysm at the left MCA bifurcation.   After discussion with [facility] she was transported to [facility] for intervention.  On arrival, noted to have blown L pupil.  Seen by NSGY and STAT repeat CT Head ordered.  To OR 6/19 with NSGY for aneurysm clipping.  Pertinent Medical History:   Past Medical History:  Diagnosis Date   Asthma    COPD (chronic obstructive pulmonary disease) (HCC)    Hypertension    Significant Hospital Events: Including procedures, antibiotic start and stop dates in addition to other pertinent events   6/18 transferred from [facility] to [facility] for L frontal ICH, intubated. NSGY consulted. L pupil 80mm/dilated, then overnight improved to 19mm/reactive. Once again 16mm on 0200 assessment. 6/19 L pupil improved, 66mm/reactive. To OR for aneurysm clipping.  Interim History / Subjective:  L pupillary changes throughout the night 53mm/dilated on arrival, improved to 7mm/reactive, then 0200 75mm again Improved this morning to 46mm reactive once again Anxious with suctioning this AM per RRT OR with NSGY for aneurysm clipping  Objective:  Blood pressure 114/68, pulse (!) 57, temperature 98.9 F (37.2 C), temperature source Axillary, resp. rate 16,  weight 119.1 kg, SpO2 100 %.    Vent Mode: PRVC FiO2 (%):  [40 %-60 %] 40 % Set Rate:  [16 bmp] 16 bmp Vt Set:  [470 mL-500 mL] 470 mL PEEP:  [5 cmH20] 5 cmH20   Intake/Output Summary (Last 24 hours) at 03/11/2022 0806 Last data filed at 03/14/2022 0700 Gross per 24 hour  Intake 350.57 ml  Output 350 ml  Net 0.57 ml   Filed Weights   04-07-22 2100 03/30/2022 0500  Weight: 117.3 kg 119.1 kg   Physical Examination: General: Acutely ill-appearing woman in NAD. HEENT: [place]/AT, anicteric sclera, L pupil 43mm and reactive, R pupil 76mm reactive, moist mucous membranes. ETT/OGT in place. Neuro: Sedated. Responds to noxious stimuli. Withdraws to pain in LUE, flicker in BLE. Not following commands.+Corneal, +Cough, and +Gag  CV: RRR, no m/g/r. PULM: Breathing even and unlabored on vent (PEEP 5, FiO2 40%). Lung fields with scattered rhonchi in upper fields. GI: Soft, nontender, nondistended. Normoactive bowel sounds. Extremities: No significant LE edema noted. Skin: Warm/dry, no rashes.  Resolved Hospital Problem List     Assessment & Plan:   Acute L frontal ICH/SAH and L MCA aneurysm Hypertensive Emergency  Per family, has not been taking home anti-hypertensive medications. Transferred from [facility] to [facility] CTA with 49mm aneurysm at L MCA bifurcation. - NSGY consulted, appreciate recommendations - Plan for OR 6/19 for aneurysm clipping - SBP goal < 140 - Continue Nicardipine titrated to SBP goal - Frequent neuro checks - Neuroprotective measures: HOB > 30 degrees, normoglycemia, normothermia, electrolytes WNL  Acute respiratory insufficiency and  aspiration pneumonia Secondary to the above. - Continue full vent support (4-8cc/kg IBW) - Wean FiO2 for O2 sat > 90% - Daily WUA/SBT, currently limited by mental status in the setting of ICH - VAP bundle - Bronchodilators per protocol - Pulmonary hygiene - PAD protocol for sedation: Propofol and Fentanyl for goal RASS 0 to -1 - Unasyn for  aspiration PNA coverage - Intermittent CXR  Hypokalemia - Trend BMP - Replete electrolytes as indicated - Monitor I&Os  History of Asthma, possible COPD and tobacco use No home inhalers/medications. - Bronchodilators (albuterol) PRN - Encourage tobacco cessation when able to participate in care  Best Practice: (right click and "Reselect all SmartList Selections" daily)   Diet/type: NPO DVT prophylaxis: SCD GI prophylaxis: PPI Lines: N/A Foley:  Yes, and it is still needed Code Status:  full code Last date of multidisciplinary goals of care discussion [6/18 Sister Selena Batten updated at the beside]  Critical care time: 53 minutes   Tim Lair, PA-C [place] [facility] 03/11/2022 8:07 AM  Please see [email or web address] for pager details.  From 7A-7P if no response, please call [phone] After hours, please call ELink [phone]

## 2022-03-19 NOTE — Anesthesia Procedure Notes (Signed)
Procedure Name: Intubation Date/Time: 03/04/2022 1:12 PM  Performed by: Carolan Clines, CRNAPre-anesthesia Checklist: Patient identified, Emergency Drugs available, Suction available and Patient being monitored Patient Re-evaluated:Patient Re-evaluated prior to induction Oxygen Delivery Method: Circle System Utilized Preoxygenation: Pre-oxygenation with 100% oxygen Induction Type: IV induction Ventilation: Oral airway inserted - appropriate to patient size and Mask ventilation without difficulty Laryngoscope Size: Mac and 3 Grade View: Grade I Tube type: Oral Tube size: 7.5 mm Number of attempts: 1 Airway Equipment and Method: Stylet and Oral airway Placement Confirmation: ETT inserted through vocal cords under direct vision, positive ETCO2 and breath sounds checked- equal and bilateral Secured at: 22 cm Tube secured with: Tape Dental Injury: Teeth and Oropharynx as per pre-operative assessment

## 2022-03-19 NOTE — Progress Notes (Signed)
At 0200 assessment, L eye  pupil noted to be 5 non reactive, but R 3 reactive, CCM made aware of the changes

## 2022-03-19 NOTE — Transfer of Care (Signed)
Immediate Anesthesia Transfer of Care Note  Patient: Rhonda Delgado  Procedure(s) Performed: LEFT FRONTOTEMPORAL CRANIOTOMY FOR ANEURYSM CLIPPING WITH EVACUATION OF HEMATOMA (Left: Head)  Patient Location: PACU  Anesthesia Type:General  Level of Consciousness: drowsy and responds to stimulation  Airway & Oxygen Therapy: Patient Spontanous Breathing and Patient connected to face mask oxygen  Post-op Assessment: Report given to RN and Post -op Vital signs reviewed and stable  Post vital signs: Reviewed and stable  Last Vitals:  Vitals Value Taken Time  BP 126/90 03/12/2022 1720  Temp    Pulse 92 03/17/2022 1726  Resp 23 03/02/2022 1726  SpO2 94 % 03/25/2022 1726  Vitals shown include unvalidated device data.  Last Pain:  Vitals:   03/09/2022 1045  TempSrc: Oral     Cleviprex started by PACU RN, MDA Moser aware    Complications: No notable events documented.

## 2022-03-19 NOTE — Anesthesia Preprocedure Evaluation (Addendum)
Anesthesia Evaluation  Patient identified by MRN, date of birth, ID band Patient unresponsive    Reviewed: Allergy & Precautions, NPO status , Patient's Chart, lab work & pertinent test results, Unable to perform ROS - Chart review only  History of Anesthesia Complications Negative for: history of anesthetic complications  Airway Mallampati: Unable to assess       Dental   Pulmonary asthma , COPD, Current Smoker and Patient abstained from smoking.,     + decreased breath sounds      Cardiovascular hypertension, Pt. on medications and Pt. on home beta blockers  Rhythm:Regular     Neuro/Psych PSYCHIATRIC DISORDERS Depression    GI/Hepatic   Endo/Other  diabetesLab Results      Component                Value               Date                      HGBA1C                   5.3                 03/09/2022             Renal/GU Lab Results      Component                Value               Date                      CREATININE               0.74                03/20/2022                Musculoskeletal   Abdominal   Peds  Hematology  (+) Blood dyscrasia, anemia , Lab Results      Component                Value               Date                      WBC                      13.5 (H)            03/10/2022                HGB                      11.2 (L)            03/07/2022                HCT                      33.0 (L)            03/05/2022                MCV                      78.3 (L)            03/16/2022  PLT                      314                 04-07-22              Anesthesia Other Findings   Reproductive/Obstetrics                            Anesthesia Physical Anesthesia Plan  ASA: 4  Anesthesia Plan: General   Post-op Pain Management: Ofirmev IV (intra-op)*   Induction: Intravenous  PONV Risk Score and Plan: 2 and Ondansetron and Dexamethasone  Airway  Management Planned: Oral ETT  Additional Equipment: Arterial line and Ultrasound Guidance Line Placement  Intra-op Plan:   Post-operative Plan: Possible Post-op intubation/ventilation  Informed Consent:     History available from chart only  Plan Discussed with: CRNA and Surgeon  Anesthesia Plan Comments:         Anesthesia Quick Evaluation

## 2022-03-19 NOTE — Progress Notes (Addendum)
Phone consent witnessed by myself and Roselyn Bering RN for craniotomy with Dr. Conchita Paris.

## 2022-03-19 NOTE — Progress Notes (Signed)
Patient returned from PACU with a cleviprex gtt. Orders changed for cardene. After changing, patient's arterial line reading in the 170s-180s. Cardene maxed. OK per Dr Franky Macho.

## 2022-03-19 NOTE — Progress Notes (Signed)
RT called to pt's room due to self-extubating.   Upon arrival, pt is on non-rebreather with MD & RN at bedside. Pt's vitals remain stable.  RT will monitor.

## 2022-03-19 NOTE — Op Note (Signed)
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 Lisbeth Renshaw, MD  ASSISTANT: Dr. Barbaraann Barthel, MD  ANESTHESIA: General Endotracheal  EBL: 100cc  SPECIMENS: None  DRAINS: None  COMPLICATIONS: None immediate  CONDITION: Hemdoynamically stable to PACU  HISTORY: Rhonda Delgado is a 41 y.o. female initially presented to the hospital after being found down by her nephew at home.  She was initially taken [facility] and CT scan demonstrated intraparenchymal hematoma with likely subarachnoid hemorrhage.  Further CT angiogram was done demonstrating the presence of a left middle cerebral artery aneurysm as a source of hemorrhage.  She was transferred to [facility] where she was intubated in route for airway protection.  Given the location of the aneurysm, its morphology, and the presence of the intraparenchymal hematoma, surgical clipping of the aneurysm with hematoma evacuation was recommended.  The risks, benefits, and alternatives to surgery as well as the expected postoperative course were all reviewed in detail with the patient's sister.  After all questions were answered she provided informed consent on the patient's behalf.  PROCEDURE IN DETAIL: The patient was brought to the operating room. After induction of general anesthesia, the patient was positioned on the operative table in the Mayfield head holder in the supine position. All pressure points were meticulously padded. Skin incision was then marked out and prepped and draped in the usual sterile fashion.  After timeout was conducted, the curvilinear skin incision was infiltrated with local anesthetic without epinephrine.  Incision was then made sharply and carried down  through the galea.  Raney clips were applied for hemostasis.  The periosteum was then elevated, and the temporalis muscle and fascia were incised.  A single myocutaneous flap was then elevated and reflected anteriorly.  High-speed drill was then used to create multiple bur holes including 1 on the pterion, superior temporal line, and just above the root of the zygoma.  Single piece frontotemporal flap was then created with the craniotome and elevated.  Hemostasis on the epidural plane was then secured with bipolar electrocautery and morselized Gelfoam with thrombin.  High-speed drill was then used to drill down the lesser wing of the sphenoid in order to provide unobstructed access to the optical carotid cistern.  The dura was then opened in curvilinear fashion and tacked up with 4-0 Nurolon stitches.  Brain was noted to be somewhat tense, however I was able to develop the subfrontal plane.  At this point, the microscope was draped sterilely and brought into the field and the remainder of the case was done under the microscope using microdissection technique.  Initially, the Doro retraction system was attached to the Mayfield.  Both temporary and permanent clips were then selected.  Subfrontal approach was then employed to initially identify the olfactory nerve.  Further dissection was used to identify the left optic nerve.  Arachnoid overlying the optic nerve was then incised sharply and dissection was initially carried medially, followed bilaterally to release CSF from the optical carotid cistern.  The supra clinoid segment of the left internal carotid artery was then identified.  Dissection was then carried out along the internal carotid artery.  The oculomotor nerve was initially identified as was the posterior communicating and anterior choroidal arteries.  Initially, I did dissect the internal carotid artery circumferentially in order  for possible temporary clipping.  As the dissection was carried more  distally I was able to identify the ICA bifurcation including the A1 and the M1 on the left.  The left M1 was then dissected circumferentially in its proximal segment in order to have proximal control.  At this point, attention was turned to the more distal sylvian fissure.  In a similar fashion, the arachnoid was incised sharply, and dissection was carried out in the distal sylvian fissure.  I was then able to I identify the proximal segment of the 2 large M2 segments.  These were then traced down and connected with the more proximal sylvian fissure which was already dissected.  This allowed complete visualization of the MCA bifurcation.  I then noted the neck of the aneurysm projecting superiorly and towards the frontal lobe, arising at the bifurcation of the M1 segment.  I was able to easily dissected the aneurysm neck away from the origin of both M2 segments.  A standard mini titanium aneurysm clip was then selected and placed across the neck of the aneurysm.  There did not appear to be any kinking of the MCA segments.  Bipolar electrocautery was then used to dissect further along the dome of the aneurysm which appeared to be well decompressed.  The anesthesia service was then asked to administer a 12.5 mg bolus dose of ICG.  Video angiography with the infrared camera was then performed.  This demonstrated wide patency of the M1 and both M2 segments, without any filling of the aneurysm dome.  Having completed the aneurysm clipping, a small corticectomy was made on the inferolateral surface of the frontal lobe adjacent to the surgical clip.  The hematoma was immediately identified and removed easily with suction.  At this point the wound was irrigated with copious amount of normal saline irrigation.  No active bleeding was identified on the brain surface.  There did appear to be some vasospasm involving the superior division of the M2.  The MCA bifurcation including the distal M1 and proximal M2 segments  were therefore covered with pieces of Gelfoam soaked in papaverine.  The dura was then overlaid on the brain which appeared to be somewhat edematous and I was unable to achieve a dural closure.  I therefore placed multiple sheets of Gelfoam as a dural onlay graft.  The bone was then replaced and plated with standard titanium plates and screws.  Wound was then again irrigated and hemostasis was secured.  The temporalis muscle and fascia were then closed with interrupted 0 Vicryl stitches and the galea was closed with interrupted 0 Vicryl stitches.  Skin was then closed with staples.    Bacitracin ointment and sterile dressing was applied.  Patient was then removed from the Mayfield head holder and a head wrap was placed.  At the end of the case all sponge, needle, instrument, and cottonoid counts were correct.  Patient was then transferred to the stretcher, extubated, and taken to the postanesthesia care unit in stable hemodynamic condition.    Lisbeth Renshaw, MD [facility]

## 2022-03-19 NOTE — Progress Notes (Signed)
Per CRNA/anesthesiologist-- go by ART line for BP. Goal <160 systolic

## 2022-03-19 NOTE — Anesthesia Procedure Notes (Signed)
Central Venous Catheter Insertion Start/End06/10/2021 1:11 PM, 03/27/2022 1:17 PM Patient location: OR. Preanesthetic checklist: patient identified, IV checked, site marked, risks and benefits discussed, surgical consent, monitors and equipment checked, pre-op evaluation, timeout performed and anesthesia consent Position: supine Patient sedated Hand hygiene performed , maximum sterile barriers used  and Seldinger technique used Catheter size: 8 Fr Total catheter length 16. Central line was placed.Double lumen Procedure performed using ultrasound guided technique. Ultrasound Notes:anatomy identified, needle tip was noted to be adjacent to the nerve/plexus identified, no ultrasound evidence of intravascular and/or intraneural injection and image(s) printed for medical record Following insertion, line sutured, dressing applied and Biopatch. Post procedure assessment: blood return through all ports, free fluid flow and no air  Patient tolerated the procedure well with no immediate complications.

## 2022-03-19 NOTE — Assessment & Plan Note (Addendum)
Remains hypertensive on nicardipine.   - will start enteral antihypertensive, target SBP 140-190

## 2022-03-19 NOTE — Assessment & Plan Note (Addendum)
MCA aneurysm clipped and clot evacuated.  Remains more somnolent than expected.  Now moving right side.  EEG negative Reintubated for airway protection and inadequate pulmonary toilette.   - Sedation to RASS -1/0

## 2022-03-20 ENCOUNTER — Inpatient Hospital Stay (HOSPITAL_COMMUNITY): Payer: Medicaid Other

## 2022-03-20 DIAGNOSIS — G4089 Other seizures: Secondary | ICD-10-CM

## 2022-03-20 DIAGNOSIS — I611 Nontraumatic intracerebral hemorrhage in hemisphere, cortical: Secondary | ICD-10-CM

## 2022-03-20 DIAGNOSIS — R4182 Altered mental status, unspecified: Secondary | ICD-10-CM

## 2022-03-20 DIAGNOSIS — I608 Other nontraumatic subarachnoid hemorrhage: Secondary | ICD-10-CM

## 2022-03-20 LAB — POCT I-STAT 7, (LYTES, BLD GAS, ICA,H+H)
Acid-base deficit: 6 mmol/L — ABNORMAL HIGH (ref 0.0–2.0)
Acid-base deficit: 6 mmol/L — ABNORMAL HIGH (ref 0.0–2.0)
Bicarbonate: 18.4 mmol/L — ABNORMAL LOW (ref 20.0–28.0)
Bicarbonate: 19.2 mmol/L — ABNORMAL LOW (ref 20.0–28.0)
Calcium, Ion: 1.29 mmol/L (ref 1.15–1.40)
Calcium, Ion: 1.28 mmol/L (ref 1.15–1.40)
HCT: 30 % — ABNORMAL LOW (ref 36.0–46.0)
HCT: 29 % — ABNORMAL LOW (ref 36.0–46.0)
Hemoglobin: 10.2 g/dL — ABNORMAL LOW (ref 12.0–15.0)
Hemoglobin: 9.9 g/dL — ABNORMAL LOW (ref 12.0–15.0)
O2 Saturation: 90 %
O2 Saturation: 91 %
Patient temperature: 98
Potassium: 3.5 mmol/L (ref 3.5–5.1)
Potassium: 3.7 mmol/L (ref 3.5–5.1)
Sodium: 142 mmol/L (ref 135–145)
Sodium: 141 mmol/L (ref 135–145)
TCO2: 19 mmol/L — ABNORMAL LOW (ref 22–32)
TCO2: 20 mmol/L — ABNORMAL LOW (ref 22–32)
pCO2 arterial: 31.2 mmHg — ABNORMAL LOW (ref 32–48)
pCO2 arterial: 34.3 mmHg (ref 32–48)
pH, Arterial: 7.376 (ref 7.35–7.45)
pH, Arterial: 7.356 (ref 7.35–7.45)
pO2, Arterial: 59 mmHg — ABNORMAL LOW (ref 83–108)
pO2, Arterial: 63 mmHg — ABNORMAL LOW (ref 83–108)

## 2022-03-20 LAB — CBC
HCT: 30.3 % — ABNORMAL LOW (ref 36.0–46.0)
Hemoglobin: 8.6 g/dL — ABNORMAL LOW (ref 12.0–15.0)
MCH: 22.2 pg — ABNORMAL LOW (ref 26.0–34.0)
MCHC: 28.4 g/dL — ABNORMAL LOW (ref 30.0–36.0)
MCV: 78.3 fL — ABNORMAL LOW (ref 80.0–100.0)
Platelets: 284 10*3/uL (ref 150–400)
RBC: 3.87 MIL/uL (ref 3.87–5.11)
RDW: 19 % — ABNORMAL HIGH (ref 11.5–15.5)
WBC: 20.8 10*3/uL — ABNORMAL HIGH (ref 4.0–10.5)
nRBC: 0 % (ref 0.0–0.2)

## 2022-03-20 LAB — BASIC METABOLIC PANEL
Anion gap: 5 (ref 5–15)
Anion gap: 8 (ref 5–15)
BUN: 11 mg/dL (ref 6–20)
BUN: 15 mg/dL (ref 6–20)
CO2: 19 mmol/L — ABNORMAL LOW (ref 22–32)
CO2: 17 mmol/L — ABNORMAL LOW (ref 22–32)
Calcium: 8.7 mg/dL — ABNORMAL LOW (ref 8.9–10.3)
Calcium: 8.6 mg/dL — ABNORMAL LOW (ref 8.9–10.3)
Chloride: 115 mmol/L — ABNORMAL HIGH (ref 98–111)
Chloride: 115 mmol/L — ABNORMAL HIGH (ref 98–111)
Creatinine, Ser: 0.87 mg/dL (ref 0.44–1.00)
Creatinine, Ser: 0.9 mg/dL (ref 0.44–1.00)
GFR, Estimated: >60 mL/min (ref >60)
GFR, Estimated: >60 mL/min (ref >60)
Glucose, Bld: 142 mg/dL — ABNORMAL HIGH (ref 70–99)
Glucose, Bld: 115 mg/dL — ABNORMAL HIGH (ref 70–99)
Potassium: 3.4 mmol/L — ABNORMAL LOW (ref 3.5–5.1)
Potassium: 3.5 mmol/L (ref 3.5–5.1)
Sodium: 139 mmol/L (ref 135–145)
Sodium: 140 mmol/L (ref 135–145)

## 2022-03-20 LAB — GLUCOSE, CAPILLARY
Glucose-Capillary: 117 mg/dL — ABNORMAL HIGH (ref 70–99)
Glucose-Capillary: 126 mg/dL — ABNORMAL HIGH (ref 70–99)
Glucose-Capillary: 132 mg/dL — ABNORMAL HIGH (ref 70–99)
Glucose-Capillary: 147 mg/dL — ABNORMAL HIGH (ref 70–99)
Glucose-Capillary: 148 mg/dL — ABNORMAL HIGH (ref 70–99)
Glucose-Capillary: 148 mg/dL — ABNORMAL HIGH (ref 70–99)

## 2022-03-20 LAB — MAGNESIUM: Magnesium: 2 mg/dL (ref 1.7–2.4)

## 2022-03-20 LAB — PHOSPHORUS
Phosphorus: 1.5 mg/dL — ABNORMAL LOW (ref 2.5–4.6)
Phosphorus: 2.2 mg/dL — ABNORMAL LOW (ref 2.5–4.6)

## 2022-03-20 MED ORDER — THROMBIN 5000 UNITS EX SOLR: OROMUCOSAL | Status: DC | PRN

## 2022-03-20 MED ORDER — ORAL CARE MOUTH RINSE
15.0000 mL | OROMUCOSAL | Status: DC
  Administered 2022-03-20 – 2022-03-21 (×5): 15 mL via OROMUCOSAL

## 2022-03-20 MED ORDER — DEXMEDETOMIDINE HCL IN NACL 400 MCG/100ML IV SOLN
0.4000 ug/kg/h | INTRAVENOUS | Status: DC
  Administered 2022-03-20: 0.4 ug/kg/h via INTRAVENOUS
  Administered 2022-03-20: 0.2 ug/kg/h via INTRAVENOUS
  Administered 2022-03-21: 0.8 ug/kg/h via INTRAVENOUS
  Administered 2022-03-21: 0.5 ug/kg/h via INTRAVENOUS
  Administered 2022-03-21: 0.8 ug/kg/h via INTRAVENOUS
  Administered 2022-03-21: 1.2 ug/kg/h via INTRAVENOUS
  Administered 2022-03-22: 1 ug/kg/h via INTRAVENOUS
  Administered 2022-03-22 (×4): 1.2 ug/kg/h via INTRAVENOUS
  Administered 2022-03-22: 1 ug/kg/h via INTRAVENOUS
  Administered 2022-03-22: 0.9 ug/kg/h via INTRAVENOUS
  Administered 2022-03-22 – 2022-03-23 (×9): 1.2 ug/kg/h via INTRAVENOUS
  Administered 2022-03-24: 1 ug/kg/h via INTRAVENOUS
  Administered 2022-03-24 (×5): 1.2 ug/kg/h via INTRAVENOUS
  Administered 2022-03-24: 1.1 ug/kg/h via INTRAVENOUS
  Administered 2022-03-24: 1.2 ug/kg/h via INTRAVENOUS
  Administered 2022-03-24: 1 ug/kg/h via INTRAVENOUS
  Administered 2022-03-25 (×3): 1.2 ug/kg/h via INTRAVENOUS
  Administered 2022-03-25: 1.1 ug/kg/h via INTRAVENOUS
  Administered 2022-03-25 (×3): 1.2 ug/kg/h via INTRAVENOUS
  Administered 2022-03-25: 1 ug/kg/h via INTRAVENOUS
  Administered 2022-03-26 (×3): 1.2 ug/kg/h via INTRAVENOUS
  Administered 2022-03-26 (×2): 1.1 ug/kg/h via INTRAVENOUS
  Administered 2022-03-26 (×2): 1.2 ug/kg/h via INTRAVENOUS
  Administered 2022-03-26: 1.1 ug/kg/h via INTRAVENOUS
  Administered 2022-03-26: 1.2 ug/kg/h via INTRAVENOUS
  Administered 2022-03-27: 0.8 ug/kg/h via INTRAVENOUS
  Administered 2022-03-27 (×3): 1.2 ug/kg/h via INTRAVENOUS
  Administered 2022-03-27 (×2): 1 ug/kg/h via INTRAVENOUS
  Administered 2022-03-28: 0.8 ug/kg/h via INTRAVENOUS
  Administered 2022-03-28: 1 ug/kg/h via INTRAVENOUS
  Administered 2022-03-28 (×3): 1.2 ug/kg/h via INTRAVENOUS
  Administered 2022-03-28: 1 ug/kg/h via INTRAVENOUS
  Administered 2022-03-28: 1.2 ug/kg/h via INTRAVENOUS
  Administered 2022-03-28: 1 ug/kg/h via INTRAVENOUS
  Administered 2022-03-29 (×2): 0.9 ug/kg/h via INTRAVENOUS
  Administered 2022-03-29: 1 ug/kg/h via INTRAVENOUS
  Administered 2022-03-29: 1.2 ug/kg/h via INTRAVENOUS
  Administered 2022-03-29: 0.9 ug/kg/h via INTRAVENOUS
  Administered 2022-03-29: 1.2 ug/kg/h via INTRAVENOUS
  Administered 2022-03-30: 0.9 ug/kg/h via INTRAVENOUS
  Administered 2022-03-30 (×3): 1 ug/kg/h via INTRAVENOUS
  Administered 2022-03-30: 0.7 ug/kg/h via INTRAVENOUS
  Administered 2022-03-30: 1 ug/kg/h via INTRAVENOUS
  Administered 2022-03-30 – 2022-03-31 (×3): 0.9 ug/kg/h via INTRAVENOUS
  Administered 2022-03-31: 1 ug/kg/h via INTRAVENOUS
  Administered 2022-03-31: 1.2 ug/kg/h via INTRAVENOUS
  Administered 2022-03-31: 1 ug/kg/h via INTRAVENOUS
  Administered 2022-03-31: 1.2 ug/kg/h via INTRAVENOUS
  Administered 2022-03-31: 1 ug/kg/h via INTRAVENOUS
  Administered 2022-04-01: 0.8 ug/kg/h via INTRAVENOUS
  Administered 2022-04-01: 1 ug/kg/h via INTRAVENOUS
  Filled 2022-03-20 (×18): qty 100
  Filled 2022-03-20: qty 200
  Filled 2022-03-20 (×2): qty 100
  Filled 2022-03-20: qty 200
  Filled 2022-03-20 (×20): qty 100
  Filled 2022-03-20: qty 200
  Filled 2022-03-20 (×2): qty 100
  Filled 2022-03-20: qty 200
  Filled 2022-03-20 (×4): qty 100
  Filled 2022-03-20: qty 200
  Filled 2022-03-20 (×2): qty 100
  Filled 2022-03-20: qty 200
  Filled 2022-03-20: qty 100
  Filled 2022-03-20: qty 200
  Filled 2022-03-20 (×13): qty 100
  Filled 2022-03-20: qty 200
  Filled 2022-03-20: qty 100
  Filled 2022-03-20: qty 200
  Filled 2022-03-20: qty 100

## 2022-03-20 MED ORDER — FUROSEMIDE 10 MG/ML IJ SOLN
20.0000 mg | Freq: Once | INTRAMUSCULAR | Status: AC
  Administered 2022-03-20: 20 mg via INTRAVENOUS
  Filled 2022-03-20: qty 2

## 2022-03-20 MED ORDER — LOSARTAN POTASSIUM 50 MG PO TABS
50.0000 mg | ORAL_TABLET | Freq: Every day | ORAL | Status: DC
  Administered 2022-03-20 – 2022-03-23 (×4): 50 mg
  Filled 2022-03-20 (×4): qty 1

## 2022-03-20 MED ORDER — HYDRALAZINE HCL 20 MG/ML IJ SOLN
20.0000 mg | Freq: Four times a day (QID) | INTRAMUSCULAR | Status: DC | PRN
Start: 2022-03-20 — End: 2022-03-25
  Administered 2022-03-20 – 2022-03-25 (×4): 20 mg via INTRAVENOUS
  Filled 2022-03-20 (×5): qty 1

## 2022-03-20 MED ORDER — LOSARTAN POTASSIUM 50 MG PO TABS
50.0000 mg | ORAL_TABLET | Freq: Every day | ORAL | Status: DC
Start: 2022-03-20 — End: 2022-03-20
  Filled 2022-03-20: qty 1

## 2022-03-20 MED ORDER — POTASSIUM PHOSPHATES 15 MMOLE/5ML IV SOLN
45.0000 mmol | Freq: Once | INTRAVENOUS | Status: AC
  Administered 2022-03-20: 45 mmol via INTRAVENOUS
  Filled 2022-03-20: qty 15

## 2022-03-20 MED ORDER — ORAL CARE MOUTH RINSE: 15.0000 mL | OROMUCOSAL | Status: DC | PRN

## 2022-03-20 MED ORDER — HYDRALAZINE HCL 20 MG/ML IJ SOLN
10.0000 mg | Freq: Four times a day (QID) | INTRAMUSCULAR | Status: DC | PRN
  Administered 2022-03-20: 10 mg via INTRAVENOUS
  Filled 2022-03-20 (×2): qty 1

## 2022-03-20 NOTE — Progress Notes (Signed)
Transcranial Doppler  Date POD PCO2 HCT BP  MCA ACA PCA OPHT SIPH VERT Basilar  6/20 JH 1    Right  Left   64  45   *  *   33  31   21  16   20   *   -28  -27   -24           Right  Left                                            Right  Left                                             Right  Left                                             Right  Left                                            Right  Left                                            Right  Left                                        MCA = Middle Cerebral Artery      OPHT = Opthalmic Artery     BASILAR = Basilar Artery   ACA = Anterior Cerebral Artery     SIPH = Carotid Siphon PCA = Posterior Cerebral Artery   VERT = Verterbral Artery                   Normal MCA = 62+\-12 ACA = 50+\-12 PCA = 42+\-23   Lindegaard Ratio: RT 3.20 LT 2.05  Results can be found under chart review under CV PROC. 03/20/2022 6:18 PM Vivek Grealish RVT, RDMS

## 2022-03-20 NOTE — Progress Notes (Signed)
NAME:  Rhonda Delgado, MRN:  [id number], DOB:  12-16-1980, LOS: 2 ADMISSION DATE:  2022-03-25, CONSULTATION DATE:  25-Mar-2022 REFERRING MD:  [facility] EDP CHIEF COMPLAINT:   ICH  History of Present Illness:  41 year old woman who presented to [facility] 6/18 with AMS, vomiting and hypertension. Found to have L frontal ICH. PMHx significant for HTN, asthma/COPD and tobacco abuse.  Per chart review, patient found with AMS by nephew; per family was acting normal a few hours prior to presentation.  Taken [facility] ED where she was actively vomiting, very hypertensive and unable to follow commands.  CT Head showed focal parenchymal hemorrhage in the anterior inferior left frontal lobe measuring 4.1 x 2.1 x 2.5 cm.  Intubated and blood pressure treated.  CTA demonstrated 43mm aneurysm at the left MCA bifurcation.   After discussion with [facility]GSY she was transported to [facility] for intervention.  On arrival, noted to have blown L pupil.  Seen by NSGY and STAT repeat CT Head ordered.  To OR 6/19 with NSGY for aneurysm clipping.  Pertinent Medical History:   Past Medical History:  Diagnosis Date   Asthma    COPD (chronic obstructive pulmonary disease) (HCC)    Hypertension    Significant Hospital Events: Including procedures, antibiotic start and stop dates in addition to other pertinent events   6/18 transferred from [facility] to [facility] for L frontal ICH, intubated. NSGY consulted. L pupil 98mm/dilated, then overnight improved to 63mm/reactive. Once again 6mm on 0200 assessment. 6/19 L pupil improved, 59mm/reactive. To OR for aneurysm clipping. Self-extubated prior to OR, went to OR and reintubated for case. Extubated at the end of the case. 6/20 Remains hypertensive. Intermittently agitated with stimulation, otherwise somnolent. Precedex started. Maintaining sats on NRB. Low threshold for reintubation.  Interim History / Subjective:  Remains hypertensive to SBP 170s-180s On Nicardipine, home losartan resumed Intermittently agitated  with stimulation, otherwise somnolent Precedex started for agitation, low dose Currently maintaining sats on NRB but tenuous Low threshold for reintubation  Objective:  Blood pressure (!) 142/76, pulse 65, temperature 98 F (36.7 C), temperature source Axillary, resp. rate 20, weight 119.9 kg, SpO2 96 %.        Intake/Output Summary (Last 24 hours) at 03/20/2022 0803 Last data filed at 03/20/2022 3536 Gross per 24 hour  Intake 3502.9 ml  Output 1345 ml  Net 2157.9 ml    Filed Weights   03-25-22 2100 03/30/2022 0500 03/20/22 0352  Weight: 117.3 kg 119.1 kg 119.9 kg   Physical Examination: General: Acutely ill-appearing middle-aged woman in NAD. Intermittently agitated. HEENT: Craniotomy dressing in place, clean/dry/intact. Anicteric sclera, PERRL 63mm, moist mucous membranes. NRB in place. Neuro: Lethargic, sleepy. Intermittently agitated with stimulation. Opens eyes to noxious stimuli. Withdraws to pain in all 4 extremities. Not following commands. Moves all 4 extremities spontaneously.  +Corneal, +Cough, and +Gag  CV: Intermittently bradycardic to 50s, regular rhythm, no m/g/r. PULM: Breathing even and minimally labored on 15L NRB. Lung fields with coarse rhonchi to upper fields bilaterally. GI: Obese, soft, nontender, nondistended. Normoactive bowel sounds. Extremities: Trace bilateral symmetric LE edema noted. Skin: Warm/dry, no rashes.  Resolved Hospital Problem List     Assessment & Plan:   Acute L frontal ICH/SAH and L MCA aneurysm Hypertensive Emergency  Per family, has not been taking home anti-hypertensive medications. [facility] CTA with 18mm aneurysm at L MCA bifurcation. - NSGY primary - S/p OR 6/19 for L MCA aneurysm clipping - TCDs per protocol - Nimotop for  vasospasm prevention - EEG, r/o seizure activity given increased somnolence - SBP goal 140-160 - Nicardipine gtt titrated to SBP goal - Home losartan resumed  - Frequent neuro checks -  Neuroprotective measures: HOB > 30 degrees, normoglycemia, normothermia, electrolytes WNL  Acute respiratory insufficiency and aspiration pneumonia Secondary to the above. - Continue supplemental O2 support - Wean O2 for sat > 90% - Bronchodilators per protocol - Pulmonary hygiene - PAD protocol for sedation: Precedex for goal RASS 0 to -1 - Unasyn for aspiration PNA coverage - Intermittent CXR - Low threshold for reintubation, given somnolence   Hypokalemia - Trend BMP - Replete electrolytes as indicated - Monitor I&Os  History of Asthma, possible COPD and tobacco use No home inhalers/medications. - Bronchodilators (albuterol) PRN - Encourage tobacco cessation when able to participate in care  Best Practice: (right click and "Reselect all SmartList Selections" daily)   Diet/type: NPO DVT prophylaxis: SCD GI prophylaxis: PPI Lines: N/A Foley:  Yes, and it is still needed Code Status:  full code Last date of multidisciplinary goals of care discussion [6/18 Sister Selena Batten updated at the beside]  Critical care time: 40 minutes   Tim Lair, PA-C [facility] 03/20/22 8:03 AM  Please see [email or web address] for pager details.  From 7A-7P if no response, please call [phone] After hours, please call ELink [phone]

## 2022-03-20 NOTE — Progress Notes (Signed)
EEG complete - results pending 

## 2022-03-20 NOTE — Progress Notes (Signed)
  Transition of Care [facility]) Screening Note   Patient Details  Name: Rhonda Delgado Date of Birth: 1981/05/19   Transition of Care [facility]) CM/SW Contact:    Glennon Mac, RN Phone Number: 03/20/2022, 5:01 PM    Transition of Care Department [facility]) has reviewed patient and no TOC needs have been identified at this time. We will continue to monitor patient advancement through interdisciplinary progression rounds. If new patient transition needs arise, please place a TOC consult.  Quintella Baton, RN, BSN  Trauma/Neuro ICU Case Manager [phone]

## 2022-03-20 NOTE — Progress Notes (Signed)
[facility] ADULT ICU REPLACEMENT PROTOCOL   The patient does apply for the [facility] Adult ICU Electrolyte Replacment Protocol based on the criteria listed below:   1.Exclusion criteria: TCTS patients, ECMO patients, and Dialysis patients 2. Is GFR >/= 30 ml/min? Yes.    Patient's GFR today is >60 3. Is SCr </= 2? Yes.   Patient's SCr is 0.87 mg/dL 4. Did SCr increase >/= 0.5 in 24 hours? No. 5.Pt's weight >40kg  Yes.   6. Abnormal electrolyte(s): Phos 1.5, K+ 3.4  7. Electrolytes replaced per protocol 8.  Call MD STAT for K+ </= 2.5, Phos </= 1, or Mag </= 1 Physician:  Dr. Henry Russel  Rhonda Delgado 03/20/2022 6:04 AM

## 2022-03-20 NOTE — Procedures (Signed)
Patient Name: LIONA WENGERT  MRN: [id number]  Epilepsy Attending: Charlsie Quest  Referring Physician/Provider: Tim Lair, PA-C Date:  03/20/2022 Duration: 22.03 mins  Patient history: 41 y.o. female SAHd# 2 POD#1 s/p clipping LMCA aneurysm. EEG to evaluate for seizure.  Level of alertness: lethargic   AEDs during EEG study: None  Technical aspects: This EEG study was done with scalp electrodes positioned according to the 10-20 International system of electrode placement. Electrical activity was acquired at a sampling rate of 500Hz  and reviewed with a high frequency filter of 70Hz  and a low frequency filter of 1Hz . EEG data were recorded continuously and digitally stored.   Description: EEG showed continuous generalized and maximal left posterior quadrant 3 to 7 Hz theta-delta slowing. Hyperventilation and photic stimulation were not performed.     ABNORMALITY - Continuous slow, generalized and maximal left posterior quadrant  IMPRESSION: This study is suggestive of cortical dysfunction arising from left posterior quadrant likely secondary to underlying structural abnormality. There is also moderate to severe diffuse encephalopathy, nonspecific etiology. No seizures or epileptiform discharges were seen throughout the recording.  If suspicion for interictal activity remains a concern, a prolonged study can be considered.    Mozell Hardacre 

## 2022-03-20 NOTE — Progress Notes (Signed)
eLink Physician-Brief Progress Note Patient Name: Rhonda Delgado DOB: [birth year] MRN: [id number]   Date of Service  03/20/2022  HPI/Events of Note  Respiratory status remains tenuous.  Somnolent and restless at times.  Needing NRB mask to maintain O2sat.  eICU Interventions  At high risk of needing intubation Continue to follow closely     Intervention Category Major Interventions: Hypoxemia - evaluation and management  Henry Russel, P 03/20/2022, 6:28 AM

## 2022-03-20 NOTE — Progress Notes (Signed)
  NEUROSURGERY PROGRESS NOTE   Pt seen and examined. No issues overnight.   EXAM: Temp:  [97.9 F (36.6 C)-99.3 F (37.4 C)] 98.6 F (37 C) (06/20 1139) Pulse Rate:  [58-91] 72 (06/20 1130) Resp:  [18-35] 26 (06/20 1130) BP: (113-186)/(62-101) 139/82 (06/20 1130) SpO2:  [87 %-99 %] 91 % (06/20 1130) Arterial Line BP: (150-221)/(54-81) 164/72 (06/20 1130) Weight:  [119.9 kg] 119.9 kg (06/20 0352) Intake/Output      06/19 0701 06/20 0700 06/20 0701 06/21 0700   I.V. (mL/kg) 3100.8 (25.9) 678.6 (5.7)   IV Piggyback 402.1 393.6   Total Intake(mL/kg) 3502.9 (29.2) 1072.2 (8.9)   Urine (mL/kg/hr) 1325 (0.5) 225 (0.4)   Emesis/NG output     Blood 20    Total Output 1345 225   Net +2157.9 +847.2         No eye opening Pupils 54mm, reactive OU Moves BUE/BLE purposefully, L>R Headwrap in place, c/d/i  LABS: Lab Results  Component Value Date   CREATININE 0.87 03/20/2022   BUN 11 03/20/2022   NA 141 03/20/2022   K 3.7 03/20/2022   CL 115 (H) 03/20/2022   CO2 19 (L) 03/20/2022   Lab Results  Component Value Date   WBC 20.8 (H) 03/20/2022   HGB 9.9 (L) 03/20/2022   HCT 29.0 (L) 03/20/2022   MCV 78.3 (L) 03/20/2022   PLT 284 03/20/2022    IMPRESSION: - 41 y.o. female SAHd# 2 POD#1 s/p clipping LMCA aneurysm. Appears neurologically stable to slightly improved - Remains tenuous from respiratory/airway standpoint - HTN  PLAN: - Cont to monitor respiratory status - SBP goal < today - Cont Nimotop - Can get EEG today   Lisbeth Renshaw, MD [facility]

## 2022-03-20 NOTE — Plan of Care (Signed)
  Problem: Safety: Goal: Non-violent Restraint(s) Outcome: Progressing   Problem: Education: Goal: Knowledge of disease or condition will improve Outcome: Progressing Goal: Knowledge of secondary prevention will improve (SELECT ALL) Outcome: Progressing

## 2022-03-21 ENCOUNTER — Inpatient Hospital Stay (HOSPITAL_COMMUNITY): Payer: Medicaid Other

## 2022-03-21 ENCOUNTER — Encounter (HOSPITAL_COMMUNITY): Payer: Self-pay | Admitting: Neurosurgery

## 2022-03-21 DIAGNOSIS — J9601 Acute respiratory failure with hypoxia: Secondary | ICD-10-CM

## 2022-03-21 DIAGNOSIS — G4089 Other seizures: Secondary | ICD-10-CM

## 2022-03-21 LAB — BASIC METABOLIC PANEL
Anion gap: 6 (ref 5–15)
BUN: 17 mg/dL (ref 6–20)
CO2: 18 mmol/L — ABNORMAL LOW (ref 22–32)
Calcium: 8.6 mg/dL — ABNORMAL LOW (ref 8.9–10.3)
Chloride: 117 mmol/L — ABNORMAL HIGH (ref 98–111)
Creatinine, Ser: 1.07 mg/dL — ABNORMAL HIGH (ref 0.44–1.00)
GFR, Estimated: >60 mL/min (ref >60)
Glucose, Bld: 119 mg/dL — ABNORMAL HIGH (ref 70–99)
Potassium: 3.4 mmol/L — ABNORMAL LOW (ref 3.5–5.1)
Sodium: 141 mmol/L (ref 135–145)

## 2022-03-21 LAB — POCT I-STAT 7, (LYTES, BLD GAS, ICA,H+H)
Acid-base deficit: 6 mmol/L — ABNORMAL HIGH (ref 0.0–2.0)
Acid-base deficit: 8 mmol/L — ABNORMAL HIGH (ref 0.0–2.0)
Bicarbonate: 17.4 mmol/L — ABNORMAL LOW (ref 20.0–28.0)
Bicarbonate: 16 mmol/L — ABNORMAL LOW (ref 20.0–28.0)
Calcium, Ion: 1.31 mmol/L (ref 1.15–1.40)
Calcium, Ion: 1.21 mmol/L (ref 1.15–1.40)
HCT: 27 % — ABNORMAL LOW (ref 36.0–46.0)
HCT: 26 % — ABNORMAL LOW (ref 36.0–46.0)
Hemoglobin: 9.2 g/dL — ABNORMAL LOW (ref 12.0–15.0)
Hemoglobin: 8.8 g/dL — ABNORMAL LOW (ref 12.0–15.0)
O2 Saturation: 86 %
O2 Saturation: 98 %
Potassium: 3.3 mmol/L — ABNORMAL LOW (ref 3.5–5.1)
Potassium: 3.3 mmol/L — ABNORMAL LOW (ref 3.5–5.1)
Sodium: 143 mmol/L (ref 135–145)
Sodium: 146 mmol/L — ABNORMAL HIGH (ref 135–145)
TCO2: 18 mmol/L — ABNORMAL LOW (ref 22–32)
TCO2: 17 mmol/L — ABNORMAL LOW (ref 22–32)
pCO2 arterial: 26.2 mmHg — ABNORMAL LOW (ref 32–48)
pCO2 arterial: 27.6 mmHg — ABNORMAL LOW (ref 32–48)
pH, Arterial: 7.429 (ref 7.35–7.45)
pH, Arterial: 7.372 (ref 7.35–7.45)
pO2, Arterial: 49 mmHg — ABNORMAL LOW (ref 83–108)
pO2, Arterial: 114 mmHg — ABNORMAL HIGH (ref 83–108)

## 2022-03-21 LAB — PHOSPHORUS
Phosphorus: 2.7 mg/dL (ref 2.5–4.6)
Phosphorus: 2.1 mg/dL — ABNORMAL LOW (ref 2.5–4.6)

## 2022-03-21 LAB — CBC
HCT: 27.5 % — ABNORMAL LOW (ref 36.0–46.0)
Hemoglobin: 8.3 g/dL — ABNORMAL LOW (ref 12.0–15.0)
MCH: 23.1 pg — ABNORMAL LOW (ref 26.0–34.0)
MCHC: 30.2 g/dL (ref 30.0–36.0)
MCV: 76.6 fL — ABNORMAL LOW (ref 80.0–100.0)
Platelets: 240 10*3/uL (ref 150–400)
RBC: 3.59 MIL/uL — ABNORMAL LOW (ref 3.87–5.11)
RDW: 19 % — ABNORMAL HIGH (ref 11.5–15.5)
WBC: 23.8 10*3/uL — ABNORMAL HIGH (ref 4.0–10.5)
nRBC: 0 % (ref 0.0–0.2)

## 2022-03-21 LAB — MAGNESIUM: Magnesium: 1.9 mg/dL (ref 1.7–2.4)

## 2022-03-21 LAB — GLUCOSE, CAPILLARY
Glucose-Capillary: 106 mg/dL — ABNORMAL HIGH (ref 70–99)
Glucose-Capillary: 130 mg/dL — ABNORMAL HIGH (ref 70–99)
Glucose-Capillary: 135 mg/dL — ABNORMAL HIGH (ref 70–99)
Glucose-Capillary: 150 mg/dL — ABNORMAL HIGH (ref 70–99)
Glucose-Capillary: 156 mg/dL — ABNORMAL HIGH (ref 70–99)
Glucose-Capillary: 173 mg/dL — ABNORMAL HIGH (ref 70–99)

## 2022-03-21 MED ORDER — FENTANYL CITRATE PF 50 MCG/ML IJ SOSY
12.5000 ug | PREFILLED_SYRINGE | INTRAMUSCULAR | Status: AC | PRN
  Administered 2022-03-22 – 2022-03-24 (×10): 50 ug via INTRAVENOUS
  Filled 2022-03-21 (×12): qty 1

## 2022-03-21 MED ORDER — MIDAZOLAM HCL 2 MG/2ML IJ SOLN
1.0000 mg | INTRAMUSCULAR | Status: AC | PRN
  Administered 2022-03-22 (×3): 1 mg via INTRAVENOUS
  Administered 2022-03-22 (×2): 2 mg via INTRAVENOUS
  Filled 2022-03-21 (×5): qty 2

## 2022-03-21 MED ORDER — MODAFINIL 100 MG PO TABS
100.0000 mg | ORAL_TABLET | Freq: Every day | ORAL | Status: DC
  Administered 2022-03-21 – 2022-03-31 (×11): 100 mg
  Filled 2022-03-21 (×11): qty 1

## 2022-03-21 MED ORDER — OSMOLITE 1.5 CAL PO LIQD
1000.0000 mL | ORAL | Status: DC
  Administered 2022-03-21 – 2022-03-31 (×13): 1000 mL

## 2022-03-21 MED ORDER — SUCCINYLCHOLINE CHLORIDE 200 MG/10ML IV SOSY: 200.0000 mg | PREFILLED_SYRINGE | Freq: Once | INTRAVENOUS | Status: AC

## 2022-03-21 MED ORDER — MIDAZOLAM HCL 2 MG/2ML IJ SOLN
INTRAMUSCULAR | Status: AC
  Filled 2022-03-21: qty 2

## 2022-03-21 MED ORDER — LACTATED RINGERS IV BOLUS
1000.0000 mL | Freq: Once | INTRAVENOUS | Status: AC
  Administered 2022-03-21: 1000 mL via INTRAVENOUS

## 2022-03-21 MED ORDER — MIDAZOLAM HCL 2 MG/2ML IJ SOLN
1.0000 mg | Freq: Once | INTRAMUSCULAR | Status: AC
  Administered 2022-03-21: 1 mg via INTRAVENOUS

## 2022-03-21 MED ORDER — POTASSIUM PHOSPHATES 15 MMOLE/5ML IV SOLN
45.0000 mmol | Freq: Once | INTRAVENOUS | Status: AC
  Administered 2022-03-21: 45 mmol via INTRAVENOUS
  Filled 2022-03-21: qty 15

## 2022-03-21 MED ORDER — FENTANYL CITRATE PF 50 MCG/ML IJ SOSY
50.0000 ug | PREFILLED_SYRINGE | Freq: Once | INTRAMUSCULAR | Status: AC
  Administered 2022-03-21: 50 ug via INTRAVENOUS

## 2022-03-21 MED ORDER — ETOMIDATE 2 MG/ML IV SOLN
20.0000 mg | Freq: Once | INTRAVENOUS | Status: AC
Start: 2022-03-21 — End: 2022-03-21

## 2022-03-21 MED ORDER — FENTANYL CITRATE PF 50 MCG/ML IJ SOSY
PREFILLED_SYRINGE | INTRAMUSCULAR | Status: AC
  Filled 2022-03-21: qty 2

## 2022-03-21 MED ORDER — SUCCINYLCHOLINE CHLORIDE 200 MG/10ML IV SOSY
PREFILLED_SYRINGE | INTRAVENOUS | Status: AC
  Administered 2022-03-21: 200 mg
  Filled 2022-03-21: qty 10

## 2022-03-21 MED ORDER — PROSOURCE TF PO LIQD
45.0000 mL | Freq: Three times a day (TID) | ORAL | Status: DC
  Administered 2022-03-21 – 2022-03-31 (×32): 45 mL
  Filled 2022-03-21 (×32): qty 45

## 2022-03-21 MED ORDER — ETOMIDATE 2 MG/ML IV SOLN
INTRAVENOUS | Status: AC
  Administered 2022-03-21: 20 mg
  Filled 2022-03-21: qty 20

## 2022-03-21 MED ORDER — ORAL CARE MOUTH RINSE
15.0000 mL | OROMUCOSAL | Status: DC
  Administered 2022-03-21 – 2022-03-30 (×106): 15 mL via OROMUCOSAL

## 2022-03-21 MED ORDER — WHITE PETROLATUM EX OINT
TOPICAL_OINTMENT | CUTANEOUS | Status: DC | PRN
Start: 2022-03-21 — End: 2022-04-01
  Filled 2022-03-21: qty 28.35

## 2022-03-21 MED FILL — Morphine Sulfate Inj 4 MG/ML: INTRAMUSCULAR | Qty: 0.25 | Status: AC

## 2022-03-21 NOTE — Progress Notes (Signed)
Transcranial Doppler  Date POD PCO2 HCT BP  MCA ACA PCA OPHT SIPH VERT Basilar  6/20 JH 1    Right  Left   64  45   *  *   33  31   21  16   20   *   -28  -27   -24      6/21 MS 2 27.6 26 116/ 62 Right  Left   71  *   -24  *   51  *   9  21   *  31   *  *   *           Right  Left                                             Right  Left                                             Right  Left                                            Right  Left                                            Right  Left                                        MCA = Middle Cerebral Artery      OPHT = Opthalmic Artery     BASILAR = Basilar Artery   ACA = Anterior Cerebral Artery     SIPH = Carotid Siphon PCA = Posterior Cerebral Artery   VERT = Verterbral Artery                   Normal MCA = 62+\-12 ACA = 50+\-12 PCA = 42+\-23   *Unable to insonate. Technically limited due to bandaging, staples, and movement.  Lindegaard Ratio: RT 2.53 LT unable to calculate  03/21/2022 3:49 PM 03/23/2022., MHA, RVT, RDCS, RDMS

## 2022-03-21 NOTE — Progress Notes (Signed)
eLink Physician-Brief Progress Note Patient Name: Rhonda Delgado DOB: 1981/07/17 MRN: [id number]   Date of Service  03/21/2022  HPI/Events of Note  Patient with intermittent marked agitation secondary to sub-optimal sedation.  eICU Interventions  PRN Fentanyl and Versed ordered.        Rhonda Delgado 03/21/2022, 10:04 PM

## 2022-03-21 NOTE — Procedures (Signed)
Intubation Procedure Note  Rhonda Delgado  [id number]  1981/06/28  Date:03/21/22  Time:2:02 PM   Provider Performing:Wandell Scullion    Procedure: Intubation (31500)  Indication(s) Respiratory Failure  Consent Risks of the procedure as well as the alternatives and risks of each were explained to the patient and/or caregiver.  Consent for the procedure was obtained and is signed in the bedside chart   Anesthesia Etomidate, Versed, Fentanyl, and Succinylcholine   Time Out Verified patient identification, verified procedure, site/side was marked, verified correct patient position, special equipment/implants available, medications/allergies/relevant history reviewed, required imaging and test results available.   Sterile Technique Usual hand hygeine, masks, and gloves were used   Procedure Description Patient positioned in bed supine.  Sedation given as noted above.  Patient was intubated with endotracheal tube using Glidescope.  View was Grade 1 full glottis .  Number of attempts was  2 .  Colorimetric CO2 detector was consistent with tracheal placement.  Potentially difficult airway due to body habitus and small mouth size.  Complications/Tolerance None; patient tolerated the procedure well. Chest X-ray is ordered to verify placement.  Lynnell Catalan, MD [facility] ICU Physician [facility] [place] [facility]  Pager: [phone] Or Epic Secure Chat After hours: [phone].  03/21/2022, 2:03 PM

## 2022-03-21 NOTE — Progress Notes (Signed)
  NEUROSURGERY PROGRESS NOTE   Pt seen and examined. No issues overnight.   EXAM: Temp:  [98.2 F (36.8 C)-99.9 F (37.7 C)] 99 F (37.2 C) (06/21 0800) Pulse Rate:  [57-82] 59 (06/21 0815) Resp:  [17-35] 22 (06/21 0815) BP: (120-161)/(58-84) 120/62 (06/21 0804) SpO2:  [66 %-97 %] 93 % (06/21 0815) Arterial Line BP: (142-183)/(56-78) 167/66 (06/21 0815) FiO2 (%):  [100 %] 100 % (06/21 0804) Weight:  [120 kg] 120 kg (06/21 0500) Intake/Output      06/20 0701 06/21 0700 06/21 0701 06/22 0700   I.V. (mL/kg) 3703 (30.9) 89.2 (0.7)   NG/GT 90    IV Piggyback 924.4 152.3   Total Intake(mL/kg) 4717.5 (39.3) 241.4 (2)   Urine (mL/kg/hr) 1185 (0.4)    Blood     Total Output 1185    Net +3532.5 +241.4         Eyes open to noxious stim Pupils 35mm, reactive OU Moves BUE/BLE purposefully, L>R Headwrap in place, c/d/i  LABS: Lab Results  Component Value Date   CREATININE 1.07 (H) 03/21/2022   BUN 17 03/21/2022   NA 143 03/21/2022   K 3.3 (L) 03/21/2022   CL 117 (H) 03/21/2022   CO2 18 (L) 03/21/2022   Lab Results  Component Value Date   WBC 23.8 (H) 03/21/2022   HGB 9.2 (L) 03/21/2022   HCT 27.0 (L) 03/21/2022   MCV 76.6 (L) 03/21/2022   PLT 240 03/21/2022    IMPRESSION: - 41 y.o. female SAHd# 3 POD#2 s/p clipping LMCA aneurysm. Appears neurologically stable to slightly improved - Remains tenuous from respiratory/airway standpoint - HTN  PLAN: - Cont to monitor respiratory status - may need to re-intubate per PCCM - SBP up to 180s mmHg - Cont Nimotop - TCD today   Lisbeth Renshaw, MD [facility]

## 2022-03-21 NOTE — Progress Notes (Signed)
[facility] ADULT ICU REPLACEMENT PROTOCOL   The patient does apply for the [facility] Adult ICU Electrolyte Replacment Protocol based on the criteria listed below:   1.Exclusion criteria: TCTS patients, ECMO patients, and Dialysis patients 2. Is GFR >/= 30 ml/min? Yes.    Patient's GFR today is >60 3. Is SCr </= 2? Yes.   Patient's SCr is 1.07 mg/dL 4. Did SCr increase >/= 0.5 in 24 hours? No. 5.Pt's weight >40kg  Yes.   6. Abnormal electrolyte(s): K 3.4, Phos 1.5  7. Electrolytes replaced per protocol 8.  Call MD STAT for K+ </= 2.5, Phos </= 1, or Mag </= 1 Physician:    Markus Daft A 03/21/2022 6:07 AM

## 2022-03-21 NOTE — Progress Notes (Signed)
   03/21/22 1800  Provider Notification  Provider Name/Title Cloyd Stagers PA  Date Provider Notified 03/21/22  Time Provider Notified 1745  Method of Notification  [facility] chat)  Notification Reason Other (Comment) (concern for lack of UOP and low readings on bladder scanner)  Provider response See new orders  Date of Provider Response 03/21/22  Time of Provider Response 1746

## 2022-03-21 NOTE — Progress Notes (Signed)
Initial Nutrition Assessment  DOCUMENTATION CODES:   Not applicable  INTERVENTION:   Initiate tube feeding via Cortrak tube: Osmolite 1.5 at 60 ml/h (1440 ml per day) Prosource TF 45 ml TID  Provides 2280 kcal, 123 gm protein, 1097 ml free water daily.  NUTRITION DIAGNOSIS:   Inadequate oral intake related to inability to eat as evidenced by NPO status.  GOAL:   Patient will meet greater than or equal to 90% of their needs  MONITOR:   Vent status, TF tolerance, Labs  REASON FOR ASSESSMENT:   Consult Enteral/tube feeding initiation and management, Assessment of nutrition requirement/status  ASSESSMENT:   41 yo female admitted with L frontal ICH r/t aneurysm. PMH includes HTN, asthma/COPD, tobacco abuse, egg allergy.  6/19 - S/P aneurysm clipping 6/20 - self extubated 6/21 - re-intubated  Patient is currently intubated on ventilator support MV: 12.5 L/min Temp (24hrs), Avg:99 F (37.2 C), Min:98.2 F (36.8 C), Max:99.9 F (37.7 C)   Cortrak placed today, tip is gastric per x-ray. Received MD Consult for TF initiation and management.  Weight history reviewed.  No significant weight changes noted recently.  Labs reviewed. K 3.3 CBG: 106-130  Medications reviewed and include Colace, Novolog, Miralax, IV K Phos.   NUTRITION - FOCUSED PHYSICAL EXAM:  Flowsheet Row Most Recent Value  Orbital Region No depletion  Upper Arm Region No depletion  Thoracic and Lumbar Region No depletion  Buccal Region No depletion  Temple Region No depletion  Clavicle Bone Region No depletion  Clavicle and Acromion Bone Region No depletion  Scapular Bone Region No depletion  Dorsal Hand No depletion  Patellar Region No depletion  Anterior Thigh Region No depletion  Posterior Calf Region No depletion  Edema (RD Assessment) None  Hair Reviewed  Eyes Unable to assess  Mouth Unable to assess  Skin Reviewed  Nails Reviewed       Diet Order:   Diet Order              Diet NPO time specified  Diet effective now                   EDUCATION NEEDS:   No education needs have been identified at this time  Skin:  Skin Assessment: Reviewed RN Assessment  Last BM:  no BM documented  Height:   Ht Readings from Last 1 Encounters:  03/16/2022 5\' 6"  (1.676 m)    Weight:   Wt Readings from Last 1 Encounters:  03/21/22 120 kg    Ideal Body Weight:  59.1 kg  BMI:  Body mass index is 42.7 kg/m.  Estimated Nutritional Needs:   Kcal:  2100-2300  Protein:  120-130 gm  Fluid:  2.1-2.3 L    03/23/22 RD, LDN, CNSC Please refer to Amion for contact information.

## 2022-03-21 NOTE — Progress Notes (Signed)
NAME:  Rhonda Delgado, MRN:  [id number], DOB:  [birth year], LOS: 3 ADMISSION DATE:  03/30/2022, CONSULTATION DATE:  03/24/2022 REFERRING MD:  [facility] EDP CHIEF COMPLAINT:   ICH  History of Present Illness:  41 year old woman who presented to [facility] 6/18 with AMS, vomiting and hypertension. Found to have L frontal ICH. PMHx significant for HTN, asthma/COPD and tobacco abuse.  Per chart review, patient found with AMS by nephew; per family was acting normal a few hours prior to presentation.  Taken [facility] ED where she was actively vomiting, very hypertensive and unable to follow commands.  CT Head showed focal parenchymal hemorrhage in the anterior inferior left frontal lobe measuring 4.1 x 2.1 x 2.5 cm.  Intubated and blood pressure treated.  CTA demonstrated 30mm aneurysm at the left MCA bifurcation.   After discussion with NGSY she was transported to [facility] for intervention.  On arrival, noted to have blown L pupil.  Seen by NSGY and STAT repeat CT Head ordered.  To OR 6/19 with NSGY for aneurysm clipping.  Pertinent Medical History:   Past Medical History:  Diagnosis Date   Asthma    COPD (chronic obstructive pulmonary disease) (HCC)    Hypertension    Significant Hospital Events: Including procedures, antibiotic start and stop dates in addition to other pertinent events   6/18 transferred from [facility] to [facility] for L frontal ICH, intubated. NSGY consulted. L pupil 48mm/dilated, then overnight improved to 62mm/reactive. Once again 32mm on 0200 assessment. 6/19 L pupil improved, 72mm/reactive. To OR for aneurysm clipping. Self-extubated prior to OR, went to OR and reintubated for case. Extubated at the end of the case. 6/20 Remains hypertensive. Intermittently agitated with stimulation, otherwise somnolent. Precedex started. Maintaining sats on NRB with transition to [facility]. 6/21 Ongoing permissive hypertension to SBP 160. Intermittently agitated on Precedex. More somnolent, Provigil started. Sats marginal on HHFNC,  FiO2 100%. Low threshold for reintubation.   Interim History / Subjective:  No significant events overnight Continued permissive hypertension with SBPs 160s On Precedex - intermittently somnolent, then agitated Moving all 4 extremities, at times appears purposeful but not following commands ABG pO2 49 today, sats 91-95% on HHFNC 40L 100% FiO2 CXR with increasing opacities, ?ongoing aspiration Low threshold for reintubation Cortrak order placed Provigil started  Objective:  Blood pressure 137/66, pulse 62, temperature 99.3 F (37.4 C), temperature source Oral, resp. rate 19, weight 120 kg, SpO2 92 %.    FiO2 (%):  [100 %] 100 %   Intake/Output Summary (Last 24 hours) at 03/21/2022 0727 Last data filed at 03/21/2022 0615 Gross per 24 hour  Intake 4594.3 ml  Output 1185 ml  Net 3409.3 ml    Filed Weights   03/27/2022 0500 03/20/22 0352 03/21/22 0500  Weight: 119.1 kg 119.9 kg 120 kg   Physical Examination: General: Acutely ill-appearing middle-aged woman in NAD. Intermittent agitation. HEENT: L craniotomy incision c/d/I without drainage, dressing in place. Anicteric sclera, PERRL 49mm, slight leftward gaze preference, moist mucous membranes. NGT in place. Neuro: Lethargic. Responds to noxious stimuli. Will withdraw to pain in all 4 extremities. Not following commands. Moves all 4 extremities spontaneously. Diminished cough/gag, will fight suction catheter. CV: Mild bradycardia to 50s-low 60s, no m/g/r. PULM: Breathing even and unlabored on HHFNC (40L, FiO2 100%). Lung fields with coarse rhonchi throughout, R > L. GI: Obese, soft, nontender, nondistended. Slightly hypoactive bowel sounds. Extremities: Bilateral symmetric 1+ LE edema noted. Skin: Warm/dry, no rashes.  Resolved Hospital Problem List     Assessment &  Plan:   Acute L frontal ICH/SAH and L MCA aneurysm; [facility] 4 Hypertensive Emergency  Per family, has not been taking home anti-hypertensive medications.  Transferred from [facility] to [facility] CTA with 50mm aneurysm at L MCA bifurcation. EEG negative for seizure/epileptiform activity 6/20. - NSGY primary - S/p OR 6/19 for L MCA aneurysm clopping - TCDs per protocol - Nimotop for vasospasm prevention - SBP goal 130-170 - Nicardipine gtt titrated to goal SBP - Home losartan resumed, PRN hydral/labetalol available - Frequent neuro checks - Neuroprotective measures: HOB > 30 degrees, normoglycemia, normothermia, electrolytes WNL - Provigil started to promote wakefulness  Acute respiratory insufficiency and aspiration pneumonia Secondary to the above. - Continue supplemental support (4-8cc/kg IBW) - Transitioned to [facility] 6/20PM, FiO2 100% and slowly uptitrating LPM - Wean FiO2 for O2 sat > 90% - Bronchodilators per protocol - VAP bundle - Pulmonary hygiene - PAD protocol for sedation: Precedex for goal RASS 0 to -1 - Continue Unasyn for aspiration PNA coverage - Follow ABG, CXR - Low threshold for reintubation 6/21 given continued somnolence and marginal respiratory status  Hypokalemia - Trend BMP - Replete electrolytes as indicated - Monitor I&Os  History of Asthma, possible COPD and tobacco use No home inhalers/medications. - Bronchodilators PRN - Encourage tobacco cessation when able to participate in care  At risk for malnutrition Physical deconditioning in the setting of ICH/SAH - Cortrak placement ordered for 6/21 - Nutrition/RD consult, appreciate assistance/recs - PT/OT/SLP when able to participate in care - Will likely need CIR when ready for discharge  Best Practice: (right click and "Reselect all SmartList Selections" daily)   Diet/type: NPO DVT prophylaxis: SCD GI prophylaxis: PPI Lines: N/A Foley:  Yes, and it is still needed Code Status:  full code Last date of multidisciplinary goals of care discussion [6/18 Sister Selena Batten updated at the beside]  Critical care time: 73 minutes   Tim Lair, PA-C [place]  [facility] 03/21/22 7:27 AM  Please see [email or web address] for pager details.  From 7A-7P if no response, please call [phone] After hours, please call ELink [phone]

## 2022-03-21 NOTE — Procedures (Signed)
Cortrak  Person Inserting Tube:  Elizah Mierzwa L, RD Tube Type:  Cortrak - 43 inches Tube Size:  10 Tube Location:  Right nare Secured by: Bridle Technique Used to Measure Tube Placement:  Marking at nare/corner of mouth Cortrak Secured At:  68 cm   Cortrak Tube Team Note:  Consult received to place a Cortrak feeding tube.   X-ray is required, abdominal x-ray has been ordered by the Cortrak team. Please confirm tube placement before using the Cortrak tube.   If the tube becomes dislodged please keep the tube and contact the Cortrak team at [email or web address] ([email or web address]) for replacement.  If after hours and replacement cannot be delayed, place a NG tube and confirm placement with an abdominal x-ray.    Anastasia Tompson RD, LDN Clinical Dietitian See AMiON for contact information.   

## 2022-03-21 NOTE — Anesthesia Postprocedure Evaluation (Signed)
Anesthesia Post Note  Patient: Rhonda Delgado  Procedure(s) Performed: LEFT FRONTOTEMPORAL CRANIOTOMY FOR ANEURYSM CLIPPING WITH EVACUATION OF HEMATOMA (Left: Head)     Patient location during evaluation: PACU Anesthesia Type: General Level of consciousness: responds to stimulation Pain management: pain level controlled Vital Signs Assessment: post-procedure vital signs reviewed and stable Respiratory status: spontaneous breathing, nonlabored ventilation, respiratory function stable and patient connected to nasal cannula oxygen Cardiovascular status: blood pressure returned to baseline and stable Postop Assessment: no apparent nausea or vomiting Anesthetic complications: no   No notable events documented.  Last Vitals:  Vitals:   03/21/22 1745 03/21/22 1800  BP:    Pulse: 70 65  Resp: (!) 27 (!) 24  Temp:    SpO2: 96% 96%    Last Pain:  Vitals:   03/21/22 1558  TempSrc: Axillary  PainSc:                  [name]

## 2022-03-21 NOTE — Progress Notes (Signed)
Bed CPT greatly agitates patient. Sats dropped into high 80s, rate jumps to 40s. Will hold off on doing CPT for the rest of shift.

## 2022-03-22 ENCOUNTER — Inpatient Hospital Stay (HOSPITAL_COMMUNITY): Payer: Medicaid Other

## 2022-03-22 DIAGNOSIS — J690 Pneumonitis due to inhalation of food and vomit: Secondary | ICD-10-CM

## 2022-03-22 DIAGNOSIS — I6389 Other cerebral infarction: Secondary | ICD-10-CM

## 2022-03-22 DIAGNOSIS — J9601 Acute respiratory failure with hypoxia: Secondary | ICD-10-CM | POA: Diagnosis present

## 2022-03-22 LAB — MAGNESIUM: Magnesium: 2 mg/dL (ref 1.7–2.4)

## 2022-03-22 LAB — GLUCOSE, CAPILLARY
Glucose-Capillary: 149 mg/dL — ABNORMAL HIGH (ref 70–99)
Glucose-Capillary: 127 mg/dL — ABNORMAL HIGH (ref 70–99)
Glucose-Capillary: 153 mg/dL — ABNORMAL HIGH (ref 70–99)
Glucose-Capillary: 157 mg/dL — ABNORMAL HIGH (ref 70–99)
Glucose-Capillary: 183 mg/dL — ABNORMAL HIGH (ref 70–99)
Glucose-Capillary: 197 mg/dL — ABNORMAL HIGH (ref 70–99)

## 2022-03-22 LAB — ECHOCARDIOGRAM COMPLETE
AR max vel: 2.29 cm2
AV Area VTI: 1.91 cm2
AV Area mean vel: 2.25 cm2
AV Mean grad: 11 mmHg
AV Peak grad: 20.3 mmHg
Ao pk vel: 2.25 m/s
Area-P 1/2: 2.82 cm2
Height: 66 in
S' Lateral: 3 cm
Weight: 4479.75 oz

## 2022-03-22 LAB — CBC
HCT: 26.3 % — ABNORMAL LOW (ref 36.0–46.0)
Hemoglobin: 7.9 g/dL — ABNORMAL LOW (ref 12.0–15.0)
MCH: 23.1 pg — ABNORMAL LOW (ref 26.0–34.0)
MCHC: 30 g/dL (ref 30.0–36.0)
MCV: 76.9 fL — ABNORMAL LOW (ref 80.0–100.0)
Platelets: 224 10*3/uL (ref 150–400)
RBC: 3.42 MIL/uL — ABNORMAL LOW (ref 3.87–5.11)
RDW: 18.7 % — ABNORMAL HIGH (ref 11.5–15.5)
WBC: 20.3 10*3/uL — ABNORMAL HIGH (ref 4.0–10.5)
nRBC: 0.1 % (ref 0.0–0.2)

## 2022-03-22 LAB — BASIC METABOLIC PANEL
Anion gap: 3 — ABNORMAL LOW (ref 5–15)
BUN: 21 mg/dL — ABNORMAL HIGH (ref 6–20)
CO2: 17 mmol/L — ABNORMAL LOW (ref 22–32)
Calcium: 7.8 mg/dL — ABNORMAL LOW (ref 8.9–10.3)
Chloride: 117 mmol/L — ABNORMAL HIGH (ref 98–111)
Creatinine, Ser: 0.94 mg/dL (ref 0.44–1.00)
GFR, Estimated: >60 mL/min (ref >60)
Glucose, Bld: 146 mg/dL — ABNORMAL HIGH (ref 70–99)
Potassium: 3.3 mmol/L — ABNORMAL LOW (ref 3.5–5.1)
Sodium: 137 mmol/L (ref 135–145)

## 2022-03-22 LAB — PHOSPHORUS: Phosphorus: 2.3 mg/dL — ABNORMAL LOW (ref 2.5–4.6)

## 2022-03-22 MED ORDER — MIDAZOLAM HCL 2 MG/2ML IJ SOLN
2.0000 mg | Freq: Once | INTRAMUSCULAR | Status: AC
  Administered 2022-03-22: 2 mg via INTRAVENOUS
  Filled 2022-03-22: qty 2

## 2022-03-22 MED ORDER — POTASSIUM CHLORIDE 20 MEQ PO PACK
40.0000 meq | PACK | Freq: Once | ORAL | Status: AC
  Administered 2022-03-22: 40 meq
  Filled 2022-03-22: qty 2

## 2022-03-22 MED ORDER — IPRATROPIUM-ALBUTEROL 0.5-2.5 (3) MG/3ML IN SOLN
3.0000 mL | RESPIRATORY_TRACT | Status: DC
  Administered 2022-03-22 – 2022-03-24 (×12): 3 mL via RESPIRATORY_TRACT
  Filled 2022-03-22 (×12): qty 3

## 2022-03-22 MED ORDER — CLEVIDIPINE BUTYRATE 0.5 MG/ML IV EMUL
0.0000 mg/h | INTRAVENOUS | Status: DC
  Administered 2022-03-22: 10 mg/h via INTRAVENOUS
  Administered 2022-03-22: 6 mg/h via INTRAVENOUS
  Administered 2022-03-22: 18 mg/h via INTRAVENOUS
  Administered 2022-03-23: 15 mg/h via INTRAVENOUS
  Administered 2022-03-23: 19 mg/h via INTRAVENOUS
  Administered 2022-03-23: 12 mg/h via INTRAVENOUS
  Administered 2022-03-23: 13 mg/h via INTRAVENOUS
  Administered 2022-03-23: 3 mg/h via INTRAVENOUS
  Administered 2022-03-24 (×2): 6 mg/h via INTRAVENOUS
  Administered 2022-03-24: 11 mg/h via INTRAVENOUS
  Administered 2022-03-24: 15 mg/h via INTRAVENOUS
  Administered 2022-03-25: 21 mg/h via INTRAVENOUS
  Filled 2022-03-22 (×3): qty 100
  Filled 2022-03-22: qty 200
  Filled 2022-03-22 (×4): qty 100
  Filled 2022-03-22: qty 200
  Filled 2022-03-22 (×2): qty 100

## 2022-03-22 MED ORDER — PREDNISONE 20 MG PO TABS
40.0000 mg | ORAL_TABLET | Freq: Every day | ORAL | Status: DC
  Administered 2022-03-22 – 2022-03-23 (×2): 40 mg
  Filled 2022-03-22 (×3): qty 2

## 2022-03-22 MED ORDER — SODIUM CHLORIDE 3 % IN NEBU
4.0000 mL | INHALATION_SOLUTION | Freq: Two times a day (BID) | RESPIRATORY_TRACT | Status: DC
  Administered 2022-03-22 – 2022-03-23 (×4): 4 mL via RESPIRATORY_TRACT
  Filled 2022-03-22 (×3): qty 15

## 2022-03-22 MED ORDER — FUROSEMIDE 10 MG/ML IJ SOLN
80.0000 mg | Freq: Once | INTRAMUSCULAR | Status: AC
  Administered 2022-03-22: 80 mg via INTRAVENOUS
  Filled 2022-03-22: qty 8

## 2022-03-22 MED ORDER — DOCUSATE SODIUM 50 MG/5ML PO LIQD: 100.0000 mg | Freq: Two times a day (BID) | ORAL | Status: DC | PRN

## 2022-03-22 NOTE — Progress Notes (Signed)
Agitation and hypertension with daily care activities. PRN provided without complete relief. D/W Dr Warrick Parisian and ok to repeat dose x1.

## 2022-03-22 NOTE — Assessment & Plan Note (Addendum)
Tolerated self extubation today.  Extubated post-op  Remains lethargic Hypoxic on NRB - suspect atelectasis on CXR Re intubated   - Lung protective ventilation, maintain relatively high PEEP to overcome chest wall restriction.  - Continue aggressive chest PT  - Bronchodilators - Trial of diuresis - SBT once oxygenation improves, but mental status will likely drive extubation.

## 2022-03-22 NOTE — Progress Notes (Signed)
OT Cancellation Note  Patient Details Name: Rhonda Delgado MRN: [id number] DOB: December 08, 1980   Cancelled Treatment:    Reason Eval/Treat Not Completed: Patient not medically ready (Nsg asking to hold this date. Will attempt another time.)  [facility] 03/22/2022, 10:39 AM Luisa Dago, OT/L   Acute OT Clinical Specialist Acute Rehabilitation Services Pager [phone] Office [phone]

## 2022-03-22 NOTE — Progress Notes (Signed)
SBP goal <200 per Dr. Conchita Paris

## 2022-03-22 NOTE — Progress Notes (Signed)
PT Cancellation Note  Patient Details Name: Rhonda Delgado MRN: [id number] DOB: July 18, 1981   Cancelled Treatment:    Reason Eval/Treat Not Completed: Patient not medically ready. RN requesting a hold on PT this date. Will plan to follow-up another date as able.   Raymond Gurney, PT, DPT Acute Rehabilitation Services  Office: [phone]    Jewel Baize 03/22/2022, 12:22 PM

## 2022-03-22 NOTE — Progress Notes (Signed)
[facility] ADULT ICU REPLACEMENT PROTOCOL   The patient does apply for the [facility] Adult ICU Electrolyte Replacment Protocol based on the criteria listed below:   1.Exclusion criteria: TCTS patients, ECMO patients, and Dialysis patients 2. Is GFR >/= 30 ml/min? Yes.    Patient's GFR today is >60 3. Is SCr </= 2? Yes.   Patient's SCr is 0.94 mg/dL 4. Did SCr increase >/= 0.5 in 24 hours? No. 5.Pt's weight >40kg  Yes.   6. Abnormal electrolyte(s): K 3.3  7. Electrolytes replaced per protocol 8.  Call MD STAT for K+ </= 2.5, Phos </= 1, or Mag </= 1 Physician:    Markus Daft A 03/22/2022 6:40 AM

## 2022-03-22 NOTE — Progress Notes (Signed)
NAME:  Rhonda Delgado, MRN:  [id number], DOB:  01/10/1981, LOS: 4 ADMISSION DATE:  03/28/2022, CONSULTATION DATE:  03/21/2022 REFERRING MD:  [facility] EDP CHIEF COMPLAINT:   ICH  History of Present Illness:  41 year old woman who presented to [facility] 6/18 with AMS, vomiting and hypertension. Found to have L frontal ICH. PMHx significant for HTN, asthma/COPD and tobacco abuse.  Per chart review, patient found with AMS by nephew; per family was acting normal a few hours prior to presentation.  Taken [facility] ED where she was actively vomiting, very hypertensive and unable to follow commands.  CT Head showed focal parenchymal hemorrhage in the anterior inferior left frontal lobe measuring 4.1 x 2.1 x 2.5 cm.  Intubated and blood pressure treated.  CTA demonstrated 73mm aneurysm at the left MCA bifurcation.   After discussion with NGSY she was transported to [facility] for intervention.  On arrival, noted to have blown L pupil.  Seen by NSGY and STAT repeat CT Head ordered.  To OR 6/19 with NSGY for aneurysm clipping.  Pertinent Medical History:   Past Medical History:  Diagnosis Date   Asthma    COPD (chronic obstructive pulmonary disease) (HCC)    Hypertension    Significant Hospital Events: Including procedures, antibiotic start and stop dates in addition to other pertinent events   6/18 transferred from [facility] to [facility] for L frontal ICH, intubated. NSGY consulted. L pupil 43mm/dilated, then overnight improved to 65mm/reactive. Once again 10mm on 0200 assessment. 6/19 L pupil improved, 77mm/reactive. To OR for aneurysm clipping. Self-extubated prior to OR, went to OR and reintubated for case. Extubated at the end of the case. 6/20 Remains hypertensive. Intermittently agitated with stimulation, otherwise somnolent. Precedex started. Maintaining sats on NRB with transition to [facility]. 6/21 Ongoing permissive hypertension to SBP 160. Intermittently agitated on Precedex. More somnolent, Provigil started. Sats marginal on HHFNC,  FiO2 100%. Low threshold for reintubation. Reintubated in afternoon  6/22 worse Cxr.   Interim History / Subjective:  Reintubated yesterday afternoon   FiO2 incr from 60% to 70% overnight, PEEP 10. Tachypnea -- RR 30-35. Difficult to recover from stimulation   This morning is air trapping to a degree, sounds junky. Lots of vent changes tried without significant improvement (pt looks to most favor PSV/CPAP but with tachypnea + baseline settings not sure this would sustain). Arrived back at [facility], but with a decr itime  Objective:  Blood pressure 130/62, pulse 66, temperature 99.2 F (37.3 C), temperature source Axillary, resp. rate (!) 34, weight 122.6 kg, SpO2 91 %.    Vent Mode: PRVC FiO2 (%):  [60 %-100 %] 70 % Set Rate:  [18 bmp] 18 bmp Vt Set:  [470 mL] 470 mL PEEP:  [10 cmH20-15 cmH20] 10 cmH20 Plateau Pressure:  [21 cmH20-27 cmH20] 21 cmH20   Intake/Output Summary (Last 24 hours) at 03/22/2022 0956 Last data filed at 03/22/2022 0700 Gross per 24 hour  Intake 5169.42 ml  Output 690 ml  Net 4479.42 ml   Filed Weights   03/20/22 0352 03/21/22 0500 03/22/22 0500  Weight: 119.9 kg 120 kg 122.6 kg   Physical Examination:   General: Critically ill adult F intubated sedated, uncomfortable appearing  HEENT: NCAT. Periorbital edema. ETT secure Neuro: Sedated on precedex. Does not follow commands CV: rrr. S1s2 no rgm cap refill brisk  PULM: Diffuse rhonchi, some coarse crackles. Symmetrical chest expansion, mechanically ventilated  GI: soft round + bowel sounds  Extremities: non-pitting edema BLE > BUE. No acute joint deformity  Skin: c/d/w   Resolved Hospital Problem List    AKI  Assessment & Plan:    Acute L frontal ICH/ SAH, L MCA aneurysm  -s/p clipping 6/19  P -provigil  -cont MWF TCDs -nimotop for vasospasm ppx -follow neuro exam closely  -wean sedation as tolerated  -SBP goal < 200 as below   HTN emergency  P -permissive hypertension post clipping -- SBP  goal < 200  -change cardene to clevi gtt  -cont enteral losartan.  -trial of 80mg  lasix -depending on response to clevi gtt + diruesis, might add on add'l enteral support  Acute respiratory failure with hypoxia, requiring reintubation  ? Hx COPD Tobacco use disorder Aspiration PNA -CXR with incr opacities 6/22.  Aspiration, edema, atelectasis, effusion P -cont MV support -Unasyn  -Sched duoneb + PRN.  -adding hypertonic nebs for thick resp secretions. Cont pulm hygiene, CPT -adding pred 40mg  -- hopefully will be short course of steroids but will await response before determining duration/wean -might need a bronch if less invasive measures are not sufficient   Hypokalemia -adding additional replacement with attempt at diuresis -AM BMP   Anemia -critical illness, iatrogenic blood loss P -trend CBC, as white count decr hope to space out and minimize blood loss related to labs   NAGMA -supportive care   Physical deconditioning due to ICH/SAH Inadequate PO intake, at risk for malnutrition - EN per RDN - PT/OT/SLP when able to participate in care - Will likely need CIR when ready for discharge  Best Practice: (right click and "Reselect all SmartList Selections" daily)   Diet/type: NPO DVT prophylaxis: SCD GI prophylaxis: PPI Lines: N/A Foley:  Yes, and it is still needed Code Status:  full code Last date of multidisciplinary goals of care discussion [6/18 Sister updated at the beside]  CRITICAL CARE Performed by: 02-07-2002   Total critical care time: 48 minutes  Critical care time was exclusive of separately billable procedures and treating other patients. Critical care was necessary to treat or prevent imminent or life-threatening deterioration.  Critical care was time spent personally by me on the following activities: development of treatment plan with patient and/or surrogate as well as nursing, discussions with consultants, evaluation of patient's  response to treatment, examination of patient, obtaining history from patient or surrogate, ordering and performing treatments and interventions, ordering and review of laboratory studies, ordering and review of radiographic studies, pulse oximetry and re-evaluation of patient's condition.   Selena Batten MSN, AGACNP-BC [facility] Pulmonary/Critical Care Medicine Amion for pager 03/22/2022, 9:56 AM

## 2022-03-22 NOTE — Progress Notes (Signed)
Pt became agitated, her BP elevated and her RR increased after her morning chest xray. PRN medications given for sedation and to control BP. PRN's only helped minimally. Elink made aware. Another dose of versed was ordered. Pt BP and RR still elevated when report given to day shift RN.

## 2022-03-22 NOTE — Progress Notes (Signed)
An USGPIV (ultrasound guided PIV) has been placed for short-term vasopressor infusion. A correctly placed ivWatch must be used when administering Vasopressors. Should this treatment be needed beyond 72 hours, central line access should be obtained.  It will be the responsibility of the bedside nurse to follow best practice to prevent extravasations.   ?

## 2022-03-23 ENCOUNTER — Inpatient Hospital Stay (HOSPITAL_COMMUNITY): Payer: Medicaid Other

## 2022-03-23 DIAGNOSIS — I608 Other nontraumatic subarachnoid hemorrhage: Secondary | ICD-10-CM

## 2022-03-23 DIAGNOSIS — J690 Pneumonitis due to inhalation of food and vomit: Secondary | ICD-10-CM

## 2022-03-23 LAB — CBC
HCT: 27.2 % — ABNORMAL LOW (ref 36.0–46.0)
Hemoglobin: 7.9 g/dL — ABNORMAL LOW (ref 12.0–15.0)
MCH: 22.5 pg — ABNORMAL LOW (ref 26.0–34.0)
MCHC: 29 g/dL — ABNORMAL LOW (ref 30.0–36.0)
MCV: 77.5 fL — ABNORMAL LOW (ref 80.0–100.0)
Platelets: 233 10*3/uL (ref 150–400)
RBC: 3.51 MIL/uL — ABNORMAL LOW (ref 3.87–5.11)
RDW: 18.8 % — ABNORMAL HIGH (ref 11.5–15.5)
WBC: 18.7 10*3/uL — ABNORMAL HIGH (ref 4.0–10.5)
nRBC: 0.2 % (ref 0.0–0.2)

## 2022-03-23 LAB — TYPE AND SCREEN
ABO/RH(D): A POS
Antibody Screen: NEGATIVE
Unit division: 0
Unit division: 0

## 2022-03-23 LAB — BASIC METABOLIC PANEL
Anion gap: 8 (ref 5–15)
BUN: 19 mg/dL (ref 6–20)
CO2: 20 mmol/L — ABNORMAL LOW (ref 22–32)
Calcium: 8.7 mg/dL — ABNORMAL LOW (ref 8.9–10.3)
Chloride: 118 mmol/L — ABNORMAL HIGH (ref 98–111)
Creatinine, Ser: 0.72 mg/dL (ref 0.44–1.00)
GFR, Estimated: >60 mL/min (ref >60)
Glucose, Bld: 136 mg/dL — ABNORMAL HIGH (ref 70–99)
Potassium: 3.3 mmol/L — ABNORMAL LOW (ref 3.5–5.1)
Sodium: 146 mmol/L — ABNORMAL HIGH (ref 135–145)

## 2022-03-23 LAB — BPAM RBC
Blood Product Expiration Date: 202307092359
Blood Product Expiration Date: 202307092359
ISSUE DATE / TIME: 202306191350
ISSUE DATE / TIME: 202306191350
Unit Type and Rh: 6200
Unit Type and Rh: 6200

## 2022-03-23 LAB — GLUCOSE, CAPILLARY
Glucose-Capillary: 136 mg/dL — ABNORMAL HIGH (ref 70–99)
Glucose-Capillary: 215 mg/dL — ABNORMAL HIGH (ref 70–99)
Glucose-Capillary: 169 mg/dL — ABNORMAL HIGH (ref 70–99)
Glucose-Capillary: 141 mg/dL — ABNORMAL HIGH (ref 70–99)
Glucose-Capillary: 142 mg/dL — ABNORMAL HIGH (ref 70–99)
Glucose-Capillary: 124 mg/dL — ABNORMAL HIGH (ref 70–99)

## 2022-03-23 LAB — CULTURE, BLOOD (ROUTINE X 2)
Culture: NO GROWTH
Culture: NO GROWTH
Special Requests: ADEQUATE

## 2022-03-23 MED ORDER — LOSARTAN POTASSIUM 50 MG PO TABS
100.0000 mg | ORAL_TABLET | Freq: Every day | ORAL | Status: DC
  Administered 2022-03-24 – 2022-03-31 (×8): 100 mg
  Filled 2022-03-23 (×8): qty 2

## 2022-03-23 MED ORDER — SODIUM CHLORIDE 0.9 % IV SOLN
2.0000 g | INTRAVENOUS | Status: AC
  Administered 2022-03-23 – 2022-03-25 (×3): 2 g via INTRAVENOUS
  Filled 2022-03-23 (×3): qty 20

## 2022-03-23 MED ORDER — MIDAZOLAM HCL 2 MG/2ML IJ SOLN
5.0000 mg | Freq: Once | INTRAMUSCULAR | Status: AC
  Administered 2022-03-26: 5 mg via INTRAVENOUS
  Filled 2022-03-23: qty 6

## 2022-03-23 MED ORDER — PROPOFOL 10 MG/ML IV BOLUS
50.0000 mg | Freq: Once | INTRAVENOUS | Status: AC
  Administered 2022-03-26: 50 mg via INTRAVENOUS
  Filled 2022-03-23: qty 20

## 2022-03-23 MED ORDER — POTASSIUM CHLORIDE 10 MEQ/100ML IV SOLN
10.0000 meq | INTRAVENOUS | Status: AC
  Administered 2022-03-23 (×4): 10 meq via INTRAVENOUS
  Filled 2022-03-23 (×4): qty 100

## 2022-03-23 MED ORDER — FENTANYL CITRATE PF 50 MCG/ML IJ SOSY
200.0000 ug | PREFILLED_SYRINGE | Freq: Once | INTRAMUSCULAR | Status: AC
  Administered 2022-03-26: 200 ug via INTRAVENOUS
  Filled 2022-03-23: qty 4

## 2022-03-23 MED ORDER — ISOSORB DINITRATE-HYDRALAZINE 20-37.5 MG PO TABS
2.0000 | ORAL_TABLET | Freq: Three times a day (TID) | ORAL | Status: DC
  Administered 2022-03-23 – 2022-03-31 (×26): 2
  Filled 2022-03-23 (×30): qty 2

## 2022-03-23 MED ORDER — FUROSEMIDE 10 MG/ML IJ SOLN
40.0000 mg | Freq: Once | INTRAMUSCULAR | Status: AC
  Administered 2022-03-23: 40 mg via INTRAVENOUS
  Filled 2022-03-23: qty 4

## 2022-03-23 MED ORDER — ROCURONIUM BROMIDE 10 MG/ML (PF) SYRINGE
100.0000 mg | PREFILLED_SYRINGE | Freq: Once | INTRAVENOUS | Status: AC
  Administered 2022-03-26: 100 mg via INTRAVENOUS
  Filled 2022-03-23 (×2): qty 10

## 2022-03-23 MED ORDER — POTASSIUM CHLORIDE 20 MEQ PO PACK
40.0000 meq | PACK | ORAL | Status: AC
  Administered 2022-03-23: 40 meq
  Filled 2022-03-23: qty 2

## 2022-03-23 MED ORDER — SODIUM CHLORIDE 0.9% FLUSH
10.0000 mL | Freq: Two times a day (BID) | INTRAVENOUS | Status: DC
  Administered 2022-03-23 – 2022-03-31 (×17): 10 mL

## 2022-03-23 MED ORDER — ISOSORB DINITRATE-HYDRALAZINE 20-37.5 MG PO TABS
2.0000 | ORAL_TABLET | Freq: Three times a day (TID) | ORAL | Status: DC
  Filled 2022-03-23 (×2): qty 2

## 2022-03-23 MED ORDER — MAGNESIUM SULFATE 2 GM/50ML IV SOLN
2.0000 g | Freq: Once | INTRAVENOUS | Status: AC
  Administered 2022-03-23: 2 g via INTRAVENOUS
  Filled 2022-03-23: qty 50

## 2022-03-23 MED ORDER — POTASSIUM CHLORIDE 20 MEQ PO PACK
20.0000 meq | PACK | ORAL | Status: DC
  Administered 2022-03-23: 20 meq
  Filled 2022-03-23 (×2): qty 1

## 2022-03-24 ENCOUNTER — Inpatient Hospital Stay (HOSPITAL_COMMUNITY): Payer: Medicaid Other

## 2022-03-24 DIAGNOSIS — J988 Other specified respiratory disorders: Secondary | ICD-10-CM

## 2022-03-24 LAB — CBC
HCT: 25.4 % — ABNORMAL LOW (ref 36.0–46.0)
Hemoglobin: 7.7 g/dL — ABNORMAL LOW (ref 12.0–15.0)
MCH: 23.4 pg — ABNORMAL LOW (ref 26.0–34.0)
MCHC: 30.3 g/dL (ref 30.0–36.0)
MCV: 77.2 fL — ABNORMAL LOW (ref 80.0–100.0)
Platelets: 223 10*3/uL (ref 150–400)
RBC: 3.29 MIL/uL — ABNORMAL LOW (ref 3.87–5.11)
RDW: 19 % — ABNORMAL HIGH (ref 11.5–15.5)
WBC: 16.2 10*3/uL — ABNORMAL HIGH (ref 4.0–10.5)
nRBC: 0.4 % — ABNORMAL HIGH (ref 0.0–0.2)

## 2022-03-24 LAB — BASIC METABOLIC PANEL
Anion gap: 5 (ref 5–15)
BUN: 24 mg/dL — ABNORMAL HIGH (ref 6–20)
CO2: 22 mmol/L (ref 22–32)
Calcium: 8.6 mg/dL — ABNORMAL LOW (ref 8.9–10.3)
Chloride: 119 mmol/L — ABNORMAL HIGH (ref 98–111)
Creatinine, Ser: 0.73 mg/dL (ref 0.44–1.00)
GFR, Estimated: >60 mL/min (ref >60)
Glucose, Bld: 139 mg/dL — ABNORMAL HIGH (ref 70–99)
Potassium: 4 mmol/L (ref 3.5–5.1)
Sodium: 146 mmol/L — ABNORMAL HIGH (ref 135–145)

## 2022-03-24 LAB — GLUCOSE, CAPILLARY
Glucose-Capillary: 159 mg/dL — ABNORMAL HIGH (ref 70–99)
Glucose-Capillary: 154 mg/dL — ABNORMAL HIGH (ref 70–99)
Glucose-Capillary: 150 mg/dL — ABNORMAL HIGH (ref 70–99)
Glucose-Capillary: 201 mg/dL — ABNORMAL HIGH (ref 70–99)
Glucose-Capillary: 137 mg/dL — ABNORMAL HIGH (ref 70–99)
Glucose-Capillary: 151 mg/dL — ABNORMAL HIGH (ref 70–99)

## 2022-03-24 LAB — TRIGLYCERIDES: Triglycerides: 71 mg/dL (ref <150)

## 2022-03-24 LAB — MAGNESIUM: Magnesium: 2.7 mg/dL — ABNORMAL HIGH (ref 1.7–2.4)

## 2022-03-24 MED ORDER — MIDAZOLAM HCL 2 MG/2ML IJ SOLN
2.0000 mg | INTRAMUSCULAR | Status: AC | PRN
  Administered 2022-03-24 – 2022-03-25 (×3): 2 mg via INTRAVENOUS
  Filled 2022-03-24 (×4): qty 2

## 2022-03-24 MED ORDER — ALBUTEROL SULFATE (2.5 MG/3ML) 0.083% IN NEBU: 2.5000 mg | INHALATION_SOLUTION | RESPIRATORY_TRACT | Status: DC | PRN

## 2022-03-24 MED ORDER — REVEFENACIN 175 MCG/3ML IN SOLN
175.0000 ug | Freq: Every day | RESPIRATORY_TRACT | Status: DC
  Administered 2022-03-24 – 2022-03-31 (×8): 175 ug via RESPIRATORY_TRACT
  Filled 2022-03-24 (×10): qty 3

## 2022-03-24 MED ORDER — PREDNISONE 20 MG PO TABS
20.0000 mg | ORAL_TABLET | Freq: Every day | ORAL | Status: DC
  Administered 2022-03-24 – 2022-03-25 (×2): 20 mg
  Filled 2022-03-24 (×3): qty 1

## 2022-03-24 MED ORDER — MIDAZOLAM HCL 2 MG/2ML IJ SOLN
2.0000 mg | INTRAMUSCULAR | Status: DC | PRN
  Administered 2022-03-24 – 2022-04-01 (×17): 2 mg via INTRAVENOUS
  Filled 2022-03-24 (×17): qty 2

## 2022-03-24 MED ORDER — POLYETHYLENE GLYCOL 3350 17 G PO PACK
17.0000 g | PACK | Freq: Every day | ORAL | Status: DC
  Filled 2022-03-24: qty 1

## 2022-03-24 MED ORDER — DOCUSATE SODIUM 50 MG/5ML PO LIQD
100.0000 mg | Freq: Two times a day (BID) | ORAL | Status: DC
  Administered 2022-03-24: 100 mg
  Filled 2022-03-24 (×2): qty 10

## 2022-03-24 MED ORDER — MIDAZOLAM HCL 2 MG/2ML IJ SOLN
INTRAMUSCULAR | Status: AC
  Administered 2022-03-24: 2 mg via INTRAVENOUS
  Filled 2022-03-24: qty 2

## 2022-03-24 MED ORDER — ARFORMOTEROL TARTRATE 15 MCG/2ML IN NEBU
15.0000 ug | INHALATION_SOLUTION | Freq: Two times a day (BID) | RESPIRATORY_TRACT | Status: DC
  Administered 2022-03-24 – 2022-03-31 (×16): 15 ug via RESPIRATORY_TRACT
  Filled 2022-03-24 (×15): qty 2

## 2022-03-24 MED ORDER — BUDESONIDE 0.25 MG/2ML IN SUSP
0.2500 mg | Freq: Two times a day (BID) | RESPIRATORY_TRACT | Status: DC
  Administered 2022-03-24 – 2022-03-31 (×16): 0.25 mg via RESPIRATORY_TRACT
  Filled 2022-03-24 (×16): qty 2

## 2022-03-24 NOTE — Progress Notes (Signed)
NAME:  Rhonda Delgado, MRN:  [id number], DOB:  12/26/1980, LOS: 6 ADMISSION DATE:  03/18/2022, CONSULTATION DATE:  03/18/2022 REFERRING MD:  [facility] EDP CHIEF COMPLAINT:   ICH  History of Present Illness:  41 year old woman who presented to [facility] 6/18 with AMS, vomiting and hypertension. Found to have L frontal ICH. PMHx significant for HTN, asthma/COPD and tobacco abuse.  Per chart review, patient found with AMS by nephew; per family was acting normal a few hours prior to presentation.  Taken [facility] ED where she was actively vomiting, very hypertensive and unable to follow commands.  CT Head showed focal parenchymal hemorrhage in the anterior inferior left frontal lobe measuring 4.1 x 2.1 x 2.5 cm.  Intubated and blood pressure treated.  CTA demonstrated 4mm aneurysm at the left MCA bifurcation.   After discussion with NGSY she was transported to [facility] for intervention.  On arrival, noted to have blown L pupil.  Seen by NSGY and STAT repeat CT Head ordered.  To OR 6/19 with NSGY for aneurysm clipping.  Pertinent Medical History:  Asthma, COPD, HTN  Significant Hospital Events: Including procedures, antibiotic start and stop dates in addition to other pertinent events   6/18 transferred from [facility] to [facility] for L frontal ICH, intubated. NSGY consulted. L pupil 15mm/dilated, then overnight improved to 11mm/reactive. Once again 5mm on 0200 assessment. 6/19 L pupil improved, 82mm/reactive. To OR for aneurysm clipping. Self-extubated prior to OR, went to OR and reintubated for case. Extubated at the end of the case. 6/20 Remains hypertensive. Intermittently agitated with stimulation, otherwise somnolent. Precedex started. Maintaining sats on NRB with transition to [facility]. 6/21 Ongoing permissive hypertension to SBP 160. Intermittently agitated on Precedex. More somnolent, Provigil started. Sats marginal on HHFNC, FiO2 100%. Low threshold for reintubation. Reintubated in afternoon  6/22 worse Cxr. Started [facility] pred +  SCH BD, + 1x lasix 80. Helpted facilitate FiO2 wean  6/23 CXR a little better. 40% FiO2. Changing unasyn to rocephin 2/2 Na bump to 146   Interim History / Subjective:  Remains on sedation, full vent support.  Objective:  Blood pressure 133/86, pulse (!) 57, temperature 98.9 F (37.2 C), temperature source Oral, resp. rate 20, weight 123.2 kg, SpO2 100 %.    Vent Mode: PRVC FiO2 (%):  [40 %-55 %] 40 % Set Rate:  [18 bmp] 18 bmp Vt Set:  [470 mL] 470 mL PEEP:  [10 cmH20-12 cmH20] 10 cmH20 Plateau Pressure:  [22 cmH20-31 cmH20] 22 cmH20   Intake/Output Summary (Last 24 hours) at 03/24/2022 4696 Last data filed at 03/24/2022 0600 Gross per 24 hour  Intake 3127.07 ml  Output 2605 ml  Net 522.07 ml   Filed Weights   03/22/22 0500 03/22/22 1000 03/23/22 0500  Weight: 122.6 kg 127 kg 123.2 kg   Physical Examination:    General - sedated Eyes - pupils reactive ENT - ETT in place Cardiac - regular rate/rhythm, no murmur Chest - b/l rhonchi Abdomen - soft, non tender, + bowel sounds Extremities - 1+ edema Skin - no rashes Neuro - not following commands  Resolved Hospital Problem List   AKI  Assessment & Plan:   Compromised airway in setting of SAH, Aspiration pneumonia, acute pulmonary edema. Hx of COPD with acute exacerbation. 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 - post op care per neurosurgery - provigil to promote wakefulness - f/u TCDs - continue nimitop  HTN emergency. - goal SBP < 200 for now in setting of SAH  Microcytic anemia. - check iron levels - f/u CBC - transfuse for Hb < 7   Best Practice: (right click and "Reselect all SmartList Selections" daily)   Diet/type: tubefeeds DVT prophylaxis: SCD GI  prophylaxis: PPI Lines: Arterial Line Foley:  Yes, and it is still needed Code Status:  full code Last date of multidisciplinary goals of care discussion [6/18 Sister Selena Batten updated at the beside]   Labs:      Latest Ref Rng & Units 03/24/2022    4:44 AM 03/23/2022    4:55 AM 03/22/2022    3:40 AM  CMP  Glucose 70 - 99 mg/dL 027  253  664   BUN 6 - 20 mg/dL 24  19  21    Creatinine 0.44 - 1.00 mg/dL 4.03  4.74  2.59   Sodium 135 - 145 mmol/L 146  146  137   Potassium 3.5 - 5.1 mmol/L 4.0  3.3  3.3   Chloride 98 - 111 mmol/L 119  118  117   CO2 22 - 32 mmol/L 22  20  17    Calcium 8.9 - 10.3 mg/dL 8.6  8.7  7.8        Latest Ref Rng & Units 03/24/2022    4:44 AM 03/23/2022    4:55 AM 03/22/2022    3:40 AM  CBC  WBC 4.0 - 10.5 K/uL 16.2  18.7  20.3   Hemoglobin 12.0 - 15.0 g/dL 7.7  7.9  7.9   Hematocrit 36.0 - 46.0 % 25.4  27.2  26.3   Platelets 150 - 400 K/uL 223  233  224     ABG    Component Value Date/Time   PHART 7.372 03/21/2022 1256   PCO2ART 27.6 (L) 03/21/2022 1256   PO2ART 114 (H) 03/21/2022 1256   HCO3 16.0 (L) 03/21/2022 1256   TCO2 17 (L) 03/21/2022 1256   ACIDBASEDEF 8.0 (H) 03/21/2022 1256   O2SAT 98 03/21/2022 1256    CBG (last 3)  Recent Labs    03/23/22 1934 03/23/22 2323 03/24/22 0257  GLUCAP 142* 124* 159*     Critical care time: 35 minutes  Coralyn Helling, MD [place] [facility]/Critical [facility] Pager - [phone] 03/24/2022, 7:19 AM

## 2022-03-25 LAB — CBC
HCT: 25 % — ABNORMAL LOW (ref 36.0–46.0)
Hemoglobin: 7.3 g/dL — ABNORMAL LOW (ref 12.0–15.0)
MCH: 22.7 pg — ABNORMAL LOW (ref 26.0–34.0)
MCHC: 29.2 g/dL — ABNORMAL LOW (ref 30.0–36.0)
MCV: 77.6 fL — ABNORMAL LOW (ref 80.0–100.0)
Platelets: 222 10*3/uL (ref 150–400)
RBC: 3.22 MIL/uL — ABNORMAL LOW (ref 3.87–5.11)
RDW: 19.1 % — ABNORMAL HIGH (ref 11.5–15.5)
WBC: 15.2 10*3/uL — ABNORMAL HIGH (ref 4.0–10.5)
nRBC: 0.5 % — ABNORMAL HIGH (ref 0.0–0.2)

## 2022-03-25 LAB — BASIC METABOLIC PANEL
Anion gap: 5 (ref 5–15)
BUN: 22 mg/dL — ABNORMAL HIGH (ref 6–20)
CO2: 22 mmol/L (ref 22–32)
Calcium: 8.4 mg/dL — ABNORMAL LOW (ref 8.9–10.3)
Chloride: 120 mmol/L — ABNORMAL HIGH (ref 98–111)
Creatinine, Ser: 0.65 mg/dL (ref 0.44–1.00)
GFR, Estimated: >60 mL/min (ref >60)
Glucose, Bld: 146 mg/dL — ABNORMAL HIGH (ref 70–99)
Potassium: 3.5 mmol/L (ref 3.5–5.1)
Sodium: 147 mmol/L — ABNORMAL HIGH (ref 135–145)

## 2022-03-25 LAB — RETICULOCYTES
Immature Retic Fract: 25.6 % — ABNORMAL HIGH (ref 2.3–15.9)
RBC.: 3.27 MIL/uL — ABNORMAL LOW (ref 3.87–5.11)
Retic Count, Absolute: 74.9 10*3/uL (ref 19.0–186.0)
Retic Ct Pct: 2.3 % (ref 0.4–3.1)

## 2022-03-25 LAB — PHOSPHORUS: Phosphorus: 3.9 mg/dL (ref 2.5–4.6)

## 2022-03-25 LAB — IRON AND TIBC
Iron: 17 ug/dL — ABNORMAL LOW (ref 28–170)
Saturation Ratios: 5 % — ABNORMAL LOW (ref 10.4–31.8)
TIBC: 344 ug/dL (ref 250–450)
UIBC: 327 ug/dL

## 2022-03-25 LAB — POCT I-STAT 7, (LYTES, BLD GAS, ICA,H+H)
Acid-Base Excess: 1 mmol/L (ref 0.0–2.0)
Bicarbonate: 24.8 mmol/L (ref 20.0–28.0)
Calcium, Ion: 1.31 mmol/L (ref 1.15–1.40)
HCT: 25 % — ABNORMAL LOW (ref 36.0–46.0)
Hemoglobin: 8.5 g/dL — ABNORMAL LOW (ref 12.0–15.0)
O2 Saturation: 99 %
Patient temperature: 99
Potassium: 4.4 mmol/L (ref 3.5–5.1)
Sodium: 148 mmol/L — ABNORMAL HIGH (ref 135–145)
TCO2: 26 mmol/L (ref 22–32)
pCO2 arterial: 34.9 mmHg (ref 32–48)
pH, Arterial: 7.46 — ABNORMAL HIGH (ref 7.35–7.45)
pO2, Arterial: 110 mmHg — ABNORMAL HIGH (ref 83–108)

## 2022-03-25 LAB — GLUCOSE, CAPILLARY
Glucose-Capillary: 117 mg/dL — ABNORMAL HIGH (ref 70–99)
Glucose-Capillary: 139 mg/dL — ABNORMAL HIGH (ref 70–99)
Glucose-Capillary: 135 mg/dL — ABNORMAL HIGH (ref 70–99)
Glucose-Capillary: 131 mg/dL — ABNORMAL HIGH (ref 70–99)
Glucose-Capillary: 142 mg/dL — ABNORMAL HIGH (ref 70–99)
Glucose-Capillary: 141 mg/dL — ABNORMAL HIGH (ref 70–99)

## 2022-03-25 LAB — MAGNESIUM: Magnesium: 2.4 mg/dL (ref 1.7–2.4)

## 2022-03-25 LAB — FERRITIN: Ferritin: 13 ng/mL (ref 11–307)

## 2022-03-25 MED ORDER — FENTANYL CITRATE PF 50 MCG/ML IJ SOSY
50.0000 ug | PREFILLED_SYRINGE | INTRAMUSCULAR | Status: AC | PRN
  Administered 2022-03-25 (×3): 50 ug via INTRAVENOUS
  Filled 2022-03-25: qty 1

## 2022-03-25 MED ORDER — LABETALOL HCL 5 MG/ML IV SOLN
10.0000 mg | INTRAVENOUS | Status: DC | PRN
Start: 2022-03-25 — End: 2022-04-01
  Administered 2022-03-26: 10 mg via INTRAVENOUS
  Filled 2022-03-25: qty 4

## 2022-03-25 MED ORDER — FENTANYL CITRATE PF 50 MCG/ML IJ SOSY
50.0000 ug | PREFILLED_SYRINGE | INTRAMUSCULAR | Status: DC | PRN
  Administered 2022-03-26 – 2022-03-31 (×15): 100 ug via INTRAVENOUS
  Administered 2022-04-01 (×2): 50 ug via INTRAVENOUS
  Filled 2022-03-25 (×9): qty 2
  Filled 2022-03-25: qty 4
  Filled 2022-03-25 (×3): qty 2
  Filled 2022-03-25: qty 1
  Filled 2022-03-25 (×3): qty 2
  Filled 2022-03-25: qty 1

## 2022-03-25 MED ORDER — FENTANYL CITRATE PF 50 MCG/ML IJ SOSY
25.0000 ug | PREFILLED_SYRINGE | INTRAMUSCULAR | Status: DC | PRN
  Filled 2022-03-25: qty 1

## 2022-03-25 MED ORDER — POTASSIUM CHLORIDE 20 MEQ PO PACK
40.0000 meq | PACK | ORAL | Status: AC
  Administered 2022-03-25 (×2): 40 meq
  Filled 2022-03-25 (×2): qty 2

## 2022-03-25 MED ORDER — SODIUM CHLORIDE 0.9 % IV SOLN
250.0000 mg | Freq: Every day | INTRAVENOUS | Status: AC
  Administered 2022-03-25 – 2022-03-26 (×2): 250 mg via INTRAVENOUS
  Filled 2022-03-25 (×2): qty 20

## 2022-03-25 MED ORDER — HYDRALAZINE HCL 20 MG/ML IJ SOLN
20.0000 mg | Freq: Four times a day (QID) | INTRAMUSCULAR | Status: DC | PRN
Start: 2022-03-25 — End: 2022-04-01
  Administered 2022-03-25 – 2022-03-26 (×2): 20 mg via INTRAVENOUS
  Filled 2022-03-25 (×3): qty 1

## 2022-03-25 MED ORDER — CHLORHEXIDINE GLUCONATE CLOTH 2 % EX PADS
6.0000 | MEDICATED_PAD | Freq: Once | CUTANEOUS | Status: AC
  Administered 2022-03-26: 6 via TOPICAL

## 2022-03-25 MED ORDER — FUROSEMIDE 10 MG/ML IJ SOLN
40.0000 mg | Freq: Once | INTRAMUSCULAR | Status: AC
  Administered 2022-03-25: 40 mg via INTRAVENOUS
  Filled 2022-03-25: qty 4

## 2022-03-25 NOTE — Progress Notes (Signed)
NAME:  Rhonda Delgado, MRN:  [id number], DOB:  Jan 25, 1981, LOS: 7 ADMISSION DATE:  03/18/2022, CONSULTATION DATE:  03/18/2022 REFERRING MD:  [facility] EDP CHIEF COMPLAINT:   ICH  History of Present Illness:  41 year old woman who presented to [facility] 6/18 with AMS, vomiting and hypertension. Found to have L frontal ICH. PMHx significant for HTN, asthma/COPD and tobacco abuse.  Per chart review, patient found with AMS by nephew; per family was acting normal a few hours prior to presentation.  Taken [facility] ED where she was actively vomiting, very hypertensive and unable to follow commands.  CT Head showed focal parenchymal hemorrhage in the anterior inferior left frontal lobe measuring 4.1 x 2.1 x 2.5 cm.  Intubated and blood pressure treated.  CTA demonstrated 4mm aneurysm at the left MCA bifurcation.   After discussion with NGSY she was transported to [facility] for intervention.  On arrival, noted to have blown L pupil.  Seen by NSGY and STAT repeat CT Head ordered.  To OR 6/19 with NSGY for aneurysm clipping.  Pertinent Medical History:  Asthma, COPD, HTN  Significant Hospital Events: Including procedures, antibiotic start and stop dates in addition to other pertinent events   6/18 transferred from [facility] to [facility] for L frontal ICH, intubated. NSGY consulted. L pupil 64mm/dilated, then overnight improved to 55mm/reactive. Once again 5mm on 0200 assessment. 6/19 L pupil improved, 50mm/reactive. To OR for aneurysm clipping. Self-extubated prior to OR, went to OR and reintubated for case. Extubated at the end of the case. 6/20 Remains hypertensive. Intermittently agitated with stimulation, otherwise somnolent. Precedex started. Maintaining sats on NRB with transition to [facility]. 6/21 Ongoing permissive hypertension to SBP 160. Intermittently agitated on Precedex. More somnolent, Provigil started. Sats marginal on HHFNC, FiO2 100%. Low threshold for reintubation. Reintubated in afternoon  6/22 worse Cxr. Started [facility] pred +  SCH BD, + 1x lasix 80. Helpted facilitate FiO2 wean  6/23 CXR a little better. 40% FiO2. Changing unasyn to rocephin 2/2 Na bump to 146  6/25 changed to pressure control  Interim History / Subjective:  Breath stacking.  Needing more sedation.  Objective:  Blood pressure 139/85, pulse (!) 55, temperature 99 F (37.2 C), temperature source Oral, resp. rate (!) 21, weight 122.7 kg, SpO2 100 %.    Vent Mode: PRVC FiO2 (%):  [40 %] 40 % Set Rate:  [18 bmp] 18 bmp Vt Set:  [470 mL] 470 mL PEEP:  [8 cmH20] 8 cmH20 Plateau Pressure:  [13 cmH20-23 cmH20] 20 cmH20   Intake/Output Summary (Last 24 hours) at 03/25/2022 2841 Last data filed at 03/25/2022 0900 Gross per 24 hour  Intake 3023.6 ml  Output 4200 ml  Net -1176.4 ml   Filed Weights   03/22/22 1000 03/23/22 0500 03/25/22 0500  Weight: 127 kg 123.2 kg 122.7 kg   Physical Examination:    General - sedated Eyes - pupils reactive ENT - ETT in place Cardiac - regular rate/rhythm, no murmur Chest - equal breath sounds b/l, no wheezing or rales Abdomen - soft, non tender, + bowel sounds Extremities - 1+ edema Skin - no rashes Neuro - followed commands with [facility], back on sedation now   Resolved Hospital Problem List   AKI  Assessment & Plan:   Compromised airway in setting of SAH, Aspiration pneumonia, acute pulmonary edema. Hx of COPD with acute exacerbation. 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 - post op care per neurosurgery - provigil to promote wakefulness - f/u TCDs - continue nimitop  HTN emergency. - goal SBP < 200 for now in setting of SAH  Iron deficiency anemia. - f/u CBC - transfuse for Hb < 7 - will ask pharmacy to dose IV iron   Best Practice: (right click and "Reselect all SmartList Selections"  daily)   Diet/type: tubefeeds DVT prophylaxis: SCD GI prophylaxis: PPI Lines: Arterial Line Foley:  Yes, and it is still needed Code Status:  full code Last date of multidisciplinary goals of care discussion [6/18 Sister Selena Batten updated at the beside]   Labs:      Latest Ref Rng & Units 03/25/2022    5:53 AM 03/24/2022    4:44 AM 03/23/2022    4:55 AM  CMP  Glucose 70 - 99 mg/dL 425  956  387   BUN 6 - 20 mg/dL 22  24  19    Creatinine 0.44 - 1.00 mg/dL 5.64  3.32  9.51   Sodium 135 - 145 mmol/L 147  146  146   Potassium 3.5 - 5.1 mmol/L 3.5  4.0  3.3   Chloride 98 - 111 mmol/L 120  119  118   CO2 22 - 32 mmol/L 22  22  20    Calcium 8.9 - 10.3 mg/dL 8.4  8.6  8.7        Latest Ref Rng & Units 03/25/2022    5:53 AM 03/24/2022    4:44 AM 03/23/2022    4:55 AM  CBC  WBC 4.0 - 10.5 K/uL 15.2  16.2  18.7   Hemoglobin 12.0 - 15.0 g/dL 7.3  7.7  7.9   Hematocrit 36.0 - 46.0 % 25.0  25.4  27.2   Platelets 150 - 400 K/uL 222  223  233     ABG    Component Value Date/Time   PHART 7.372 03/21/2022 1256   PCO2ART 27.6 (L) 03/21/2022 1256   PO2ART 114 (H) 03/21/2022 1256   HCO3 16.0 (L) 03/21/2022 1256   TCO2 17 (L) 03/21/2022 1256   ACIDBASEDEF 8.0 (H) 03/21/2022 1256   O2SAT 98 03/21/2022 1256    CBG (last 3)  Recent Labs    03/24/22 2329 03/25/22 0315 03/25/22 0715  GLUCAP 151* 117* 139*    Iron/TIBC/Ferritin/ %Sat    Component Value Date/Time   IRON 17 (L) 03/25/2022 0553   TIBC 344 03/25/2022 0553   FERRITIN 13 03/25/2022 0553   IRONPCTSAT 5 (L) 03/25/2022 0553    Critical care time: 36 minutes  Coralyn Helling, MD [place] [facility]/Critical Care Pager - [phone] - [phone] 03/25/2022, 9:58 AM

## 2022-03-25 NOTE — Progress Notes (Signed)
eLink Physician-Brief Progress Note Patient Name: Rhonda Delgado DOB: June 21, 1981 MRN: [id number]   Date of Service  03/25/2022  HPI/Events of Note  PT intubated. Bedside RN reports pt appears uncomfortable and is at times agitated.   eICU Interventions  Fentanyl IV q2 hours ordered for pain control and sedation.  Will continue to monitor mental status closely.   RN also reports bibasilar crackles on exam.  Review of I/Os show pt to be postiive 11L since admission.  One time dose of lasix ordered as well.         Ebonee Stober M DELA CRUZ 03/25/2022, 12:28 AM

## 2022-03-26 ENCOUNTER — Inpatient Hospital Stay (HOSPITAL_COMMUNITY): Payer: Medicaid Other

## 2022-03-26 DIAGNOSIS — I608 Other nontraumatic subarachnoid hemorrhage: Secondary | ICD-10-CM

## 2022-03-26 LAB — CBC
HCT: 26.1 % — ABNORMAL LOW (ref 36.0–46.0)
Hemoglobin: 7.5 g/dL — ABNORMAL LOW (ref 12.0–15.0)
MCH: 22.7 pg — ABNORMAL LOW (ref 26.0–34.0)
MCHC: 28.7 g/dL — ABNORMAL LOW (ref 30.0–36.0)
MCV: 79.1 fL — ABNORMAL LOW (ref 80.0–100.0)
Platelets: 243 10*3/uL (ref 150–400)
RBC: 3.3 MIL/uL — ABNORMAL LOW (ref 3.87–5.11)
RDW: 19.3 % — ABNORMAL HIGH (ref 11.5–15.5)
WBC: 15.7 10*3/uL — ABNORMAL HIGH (ref 4.0–10.5)
nRBC: 1 % — ABNORMAL HIGH (ref 0.0–0.2)

## 2022-03-26 LAB — PROTIME-INR
INR: 1.1 (ref 0.8–1.2)
Prothrombin Time: 14.5 seconds (ref 11.4–15.2)

## 2022-03-26 LAB — BASIC METABOLIC PANEL
Anion gap: 4 — ABNORMAL LOW (ref 5–15)
BUN: 23 mg/dL — ABNORMAL HIGH (ref 6–20)
CO2: 22 mmol/L (ref 22–32)
Calcium: 8.6 mg/dL — ABNORMAL LOW (ref 8.9–10.3)
Chloride: 117 mmol/L — ABNORMAL HIGH (ref 98–111)
Creatinine, Ser: 0.68 mg/dL (ref 0.44–1.00)
GFR, Estimated: >60 mL/min (ref >60)
Glucose, Bld: 113 mg/dL — ABNORMAL HIGH (ref 70–99)
Potassium: 4 mmol/L (ref 3.5–5.1)
Sodium: 143 mmol/L (ref 135–145)

## 2022-03-26 LAB — POCT I-STAT 7, (LYTES, BLD GAS, ICA,H+H)
Acid-Base Excess: 0 mmol/L (ref 0.0–2.0)
Bicarbonate: 24.4 mmol/L (ref 20.0–28.0)
Calcium, Ion: 1.29 mmol/L (ref 1.15–1.40)
HCT: 23 % — ABNORMAL LOW (ref 36.0–46.0)
Hemoglobin: 7.8 g/dL — ABNORMAL LOW (ref 12.0–15.0)
O2 Saturation: 99 %
Patient temperature: 100
Potassium: 4.3 mmol/L (ref 3.5–5.1)
Sodium: 148 mmol/L — ABNORMAL HIGH (ref 135–145)
TCO2: 26 mmol/L (ref 22–32)
pCO2 arterial: 38.6 mmHg (ref 32–48)
pH, Arterial: 7.412 (ref 7.35–7.45)
pO2, Arterial: 160 mmHg — ABNORMAL HIGH (ref 83–108)

## 2022-03-26 LAB — GLUCOSE, CAPILLARY
Glucose-Capillary: 112 mg/dL — ABNORMAL HIGH (ref 70–99)
Glucose-Capillary: 115 mg/dL — ABNORMAL HIGH (ref 70–99)
Glucose-Capillary: 125 mg/dL — ABNORMAL HIGH (ref 70–99)
Glucose-Capillary: 129 mg/dL — ABNORMAL HIGH (ref 70–99)
Glucose-Capillary: 130 mg/dL — ABNORMAL HIGH (ref 70–99)
Glucose-Capillary: 139 mg/dL — ABNORMAL HIGH (ref 70–99)

## 2022-03-26 MED ORDER — PREDNISONE 10 MG PO TABS
10.0000 mg | ORAL_TABLET | Freq: Every day | ORAL | Status: AC
  Administered 2022-03-27 – 2022-03-29 (×3): 10 mg
  Filled 2022-03-26 (×3): qty 1

## 2022-03-26 MED ORDER — LIDOCAINE HCL (PF) 1 % IJ SOLN
INTRAMUSCULAR | Status: AC
  Filled 2022-03-26: qty 5

## 2022-03-26 NOTE — Progress Notes (Signed)
Spoke rep CCM pertaining to pt having tentative schedule for trach placement 6/26 and having TFs. CCM recommended holding TF starting at 0600.

## 2022-03-27 ENCOUNTER — Inpatient Hospital Stay (HOSPITAL_COMMUNITY): Payer: Medicaid Other

## 2022-03-27 LAB — CBC
HCT: 24.8 % — ABNORMAL LOW (ref 36.0–46.0)
Hemoglobin: 7 g/dL — ABNORMAL LOW (ref 12.0–15.0)
MCH: 22.4 pg — ABNORMAL LOW (ref 26.0–34.0)
MCHC: 28.2 g/dL — ABNORMAL LOW (ref 30.0–36.0)
MCV: 79.5 fL — ABNORMAL LOW (ref 80.0–100.0)
Platelets: 233 10*3/uL (ref 150–400)
RBC: 3.12 MIL/uL — ABNORMAL LOW (ref 3.87–5.11)
RDW: 19.1 % — ABNORMAL HIGH (ref 11.5–15.5)
WBC: 16.8 10*3/uL — ABNORMAL HIGH (ref 4.0–10.5)
nRBC: 1 % — ABNORMAL HIGH (ref 0.0–0.2)

## 2022-03-27 LAB — GLUCOSE, CAPILLARY
Glucose-Capillary: 147 mg/dL — ABNORMAL HIGH (ref 70–99)
Glucose-Capillary: 137 mg/dL — ABNORMAL HIGH (ref 70–99)
Glucose-Capillary: 127 mg/dL — ABNORMAL HIGH (ref 70–99)
Glucose-Capillary: 130 mg/dL — ABNORMAL HIGH (ref 70–99)
Glucose-Capillary: 123 mg/dL — ABNORMAL HIGH (ref 70–99)

## 2022-03-27 LAB — BASIC METABOLIC PANEL
Anion gap: 5 (ref 5–15)
BUN: 24 mg/dL — ABNORMAL HIGH (ref 6–20)
CO2: 22 mmol/L (ref 22–32)
Calcium: 8.7 mg/dL — ABNORMAL LOW (ref 8.9–10.3)
Chloride: 117 mmol/L — ABNORMAL HIGH (ref 98–111)
Creatinine, Ser: 0.68 mg/dL (ref 0.44–1.00)
GFR, Estimated: >60 mL/min (ref >60)
Glucose, Bld: 169 mg/dL — ABNORMAL HIGH (ref 70–99)
Potassium: 3.8 mmol/L (ref 3.5–5.1)
Sodium: 144 mmol/L (ref 135–145)

## 2022-03-27 MED ORDER — POTASSIUM CHLORIDE 20 MEQ PO PACK
40.0000 meq | PACK | Freq: Once | ORAL | Status: AC
  Administered 2022-03-27: 40 meq
  Filled 2022-03-27: qty 2

## 2022-03-27 MED ORDER — FUROSEMIDE 10 MG/ML IJ SOLN
40.0000 mg | Freq: Once | INTRAMUSCULAR | Status: AC
  Administered 2022-03-27: 40 mg via INTRAVENOUS
  Filled 2022-03-27: qty 4

## 2022-03-27 NOTE — Progress Notes (Addendum)
NAME:  Rhonda Delgado, MRN:  [id number], DOB:  22-Jan-1981, LOS: 9 ADMISSION DATE:  04/09/22, CONSULTATION DATE:  09-Apr-2022 REFERRING MD:  [facility] EDP CHIEF COMPLAINT:   ICH  History of Present Illness:  41 yo female presented to [facility] on April 09, 2022 with altered mental status, vomiting and hypertension.  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  Transferred to [facility] for neurosurgery assessment, and taken to OR on 6/19 for clipping of aneurysm.  PCCM consulted to assist with management in ICU.  Pertinent Medical History:  Asthma, COPD, HTN  Significant Hospital Events: Including procedures, antibiotic start and stop dates in addition to other pertinent events   6/18 transferred from [facility] to [facility] for L frontal ICH, intubated. NSGY consulted. L pupil 21mm/dilated, then overnight improved to 70mm/reactive. Once again 5mm on 0200 assessment. 6/19 L pupil improved, 52mm/reactive. To OR for aneurysm clipping. Self-extubated prior to OR, went to OR and reintubated for case. Extubated at the end of the case. 6/20 Remains hypertensive. Intermittently agitated with stimulation, otherwise somnolent. Precedex started. Maintaining sats on NRB with transition to [facility]. 6/21 Ongoing permissive hypertension to SBP 160. Intermittently agitated on Precedex. More somnolent, Provigil started. Sats marginal on HHFNC, FiO2 100%. Low threshold for reintubation. Reintubated in afternoon  6/22 worse Cxr. Started [facility] pred + SCH BD, + 1x lasix 80. Helpted facilitate FiO2 wean  6/23 CXR a little better. 40% FiO2. Changing unasyn to rocephin 2/2 Na bump to 146  6/25 changed to pressure control; pharmacy dosing IV iron 6/26 tracheostomy 6/27 start pressure support weaning  Interim History / Subjective:  Remains on precedex.    Objective:  Blood pressure 134/81, pulse (!) 57, temperature 98.8 F (37.1 C), temperature source Axillary, resp. rate (!) 21, weight 122.5 kg, SpO2 100 %.    Vent Mode: PRVC FiO2  (%):  [40 %-100 %] 40 % Set Rate:  [18 bmp-22 bmp] 22 bmp Vt Set:  [470 mL] 470 mL PEEP:  [5 cmH20] 5 cmH20 Plateau Pressure:  [13 cmH20-22 cmH20] 14 cmH20   Intake/Output Summary (Last 24 hours) at 03/27/2022 0815 Last data filed at 03/27/2022 0800 Gross per 24 hour  Intake 1833.88 ml  Output 1775 ml  Net 58.88 ml   Filed Weights   03/25/22 0500 03/26/22 0500 03/27/22 0400  Weight: 122.7 kg 123.4 kg 122.5 kg   Physical Examination:    General - sedated Eyes - pupils reactive ENT - trach site clean Cardiac - regular rate/rhythm, no murmur Chest - equal breath sounds b/l, no wheezing or rales Abdomen - soft, non tender, + bowel sounds Extremities - 1+ edema Skin - no rashes Neuro - RASS -1, follows commands  Resolved Hospital Problem List   AKI  Assessment & Plan:   Compromised airway in setting of SAH, Aspiration pneumonia, acute pulmonary edema. Reported hx of COPD with acute exacerbation. Failure to wean s/p tracheostomy 6/26. 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 - post op care per neurosurgery - provigil to promote wakefulness - f/u TCDs - continue nimitop  HTN emergency. - goal BP normotensive - continue bidil, cozaar  Iron deficiency anemia. - f/u CBC - transfuse for Hb <  7 - IV iron started per pharmacy 6/25  Steroid induced hyperglycemia. - SSI  Deconditioning. - will need PT/OT  Best Practice: (right click and "Reselect all SmartList Selections" daily)   Diet/type: tubefeeds DVT prophylaxis: SCD GI prophylaxis: PPI Lines: Arterial Line Foley:  Yes, and it is still needed Code Status:  full code Last date of multidisciplinary goals of care discussion [6/18 Sister Selena Batten  updated at the beside]   Labs:      Latest Ref Rng & Units 03/27/2022    5:59 AM 03/26/2022    5:25 PM 03/26/2022    5:19 AM  CMP  Glucose 70 - 99 mg/dL 782   956   BUN 6 - 20 mg/dL 24   23   Creatinine 2.13 - 1.00 mg/dL 0.86   5.78   Sodium 469 - 145 mmol/L 144  148  143   Potassium 3.5 - 5.1 mmol/L 3.8  4.3  4.0   Chloride 98 - 111 mmol/L 117   117   CO2 22 - 32 mmol/L 22   22   Calcium 8.9 - 10.3 mg/dL 8.7   8.6        Latest Ref Rng & Units 03/27/2022    5:59 AM 03/26/2022    5:25 PM 03/26/2022    5:19 AM  CBC  WBC 4.0 - 10.5 K/uL 16.8   15.7   Hemoglobin 12.0 - 15.0 g/dL 7.0  7.8  7.5   Hematocrit 36.0 - 46.0 % 24.8  23.0  26.1   Platelets 150 - 400 K/uL 233   243     ABG    Component Value Date/Time   PHART 7.412 03/26/2022 1725   PCO2ART 38.6 03/26/2022 1725   PO2ART 160 (H) 03/26/2022 1725   HCO3 24.4 03/26/2022 1725   TCO2 26 03/26/2022 1725   ACIDBASEDEF 8.0 (H) 03/21/2022 1256   O2SAT 99 03/26/2022 1725    CBG (last 3)  Recent Labs    03/26/22 2340 03/27/22 0351 03/27/22 0753  GLUCAP 125* 147* 137*    Iron/TIBC/Ferritin/ %Sat    Component Value Date/Time   IRON 17 (L) 03/25/2022 0553   TIBC 344 03/25/2022 0553   FERRITIN 13 03/25/2022 0553   IRONPCTSAT 5 (L) 03/25/2022 0553    Signature:  Coralyn Helling, MD [place] [facility]monary/Critical Care Pager - [phone] - [phone] 03/27/2022, 8:15 AM

## 2022-03-27 NOTE — Progress Notes (Signed)
PT Cancellation Note  Patient Details Name: Rhonda Delgado MRN: [id number] DOB: 01-09-1981   Cancelled Treatment:    Reason Eval/Treat Not Completed: Medical issues which prohibited therapy Holding per RN as pt is still maxed out on precedex as she fights the vent without it and is unable to wean. Will follow for appropriateness.   [name] A Bohdan Macho 03/27/2022, 7:48 AM [place], PT, DPT Acute Rehabilitation Services Secure chat preferred Office [phone]

## 2022-03-27 NOTE — Progress Notes (Signed)
Decreased patient's precedex from 1.2 to 0.4 throughout the day. Patient became increasingly agitated around 1700. Patient's BP increased and her rate increased to the 40s. Patient was flipped back  to full support by respiratory therapist, which did not improve rate and work of breathing. Precedex was increased and prn Versed was given.   Beryl Meager, RN

## 2022-03-28 ENCOUNTER — Inpatient Hospital Stay (HOSPITAL_COMMUNITY): Payer: Medicaid Other

## 2022-03-28 DIAGNOSIS — I608 Other nontraumatic subarachnoid hemorrhage: Secondary | ICD-10-CM

## 2022-03-28 DIAGNOSIS — I609 Nontraumatic subarachnoid hemorrhage, unspecified: Secondary | ICD-10-CM

## 2022-03-28 DIAGNOSIS — Z8679 Personal history of other diseases of the circulatory system: Secondary | ICD-10-CM

## 2022-03-28 LAB — BASIC METABOLIC PANEL
Anion gap: 7 (ref 5–15)
BUN: 23 mg/dL — ABNORMAL HIGH (ref 6–20)
CO2: 23 mmol/L (ref 22–32)
Calcium: 8.9 mg/dL (ref 8.9–10.3)
Chloride: 116 mmol/L — ABNORMAL HIGH (ref 98–111)
Creatinine, Ser: 0.68 mg/dL (ref 0.44–1.00)
GFR, Estimated: >60 mL/min (ref >60)
Glucose, Bld: 124 mg/dL — ABNORMAL HIGH (ref 70–99)
Potassium: 4 mmol/L (ref 3.5–5.1)
Sodium: 146 mmol/L — ABNORMAL HIGH (ref 135–145)

## 2022-03-28 LAB — GLUCOSE, CAPILLARY
Glucose-Capillary: 116 mg/dL — ABNORMAL HIGH (ref 70–99)
Glucose-Capillary: 118 mg/dL — ABNORMAL HIGH (ref 70–99)
Glucose-Capillary: 126 mg/dL — ABNORMAL HIGH (ref 70–99)
Glucose-Capillary: 133 mg/dL — ABNORMAL HIGH (ref 70–99)
Glucose-Capillary: 135 mg/dL — ABNORMAL HIGH (ref 70–99)
Glucose-Capillary: 150 mg/dL — ABNORMAL HIGH (ref 70–99)

## 2022-03-28 MED ORDER — FERROUS SULFATE 220 (44 FE) MG/5ML PO ELIX
220.0000 mg | ORAL_SOLUTION | Freq: Every day | ORAL | Status: DC
  Filled 2022-03-28: qty 5

## 2022-03-28 MED ORDER — SERTRALINE HCL 50 MG PO TABS
25.0000 mg | ORAL_TABLET | Freq: Every day | ORAL | Status: DC
  Administered 2022-03-28 – 2022-03-31 (×4): 25 mg
  Filled 2022-03-28 (×3): qty 1

## 2022-03-28 MED ORDER — CLONAZEPAM 0.5 MG PO TABS
0.5000 mg | ORAL_TABLET | Freq: Two times a day (BID) | ORAL | Status: DC
Start: 2022-03-28 — End: 2022-03-29
  Administered 2022-03-28 – 2022-03-29 (×3): 0.5 mg
  Filled 2022-03-28 (×3): qty 1

## 2022-03-28 MED ORDER — SERTRALINE HCL 50 MG PO TABS
25.0000 mg | ORAL_TABLET | Freq: Every day | ORAL | Status: DC
  Filled 2022-03-28: qty 1

## 2022-03-28 MED ORDER — FUROSEMIDE 10 MG/ML IJ SOLN
40.0000 mg | Freq: Once | INTRAMUSCULAR | Status: AC
  Administered 2022-03-28: 40 mg via INTRAVENOUS
  Filled 2022-03-28: qty 4

## 2022-03-28 MED ORDER — FERROUS SULFATE 300 (60 FE) MG/5ML PO SYRP
300.0000 mg | ORAL_SOLUTION | Freq: Every day | ORAL | Status: DC
  Administered 2022-03-28 – 2022-03-31 (×4): 300 mg
  Filled 2022-03-28 (×5): qty 5

## 2022-03-28 NOTE — Progress Notes (Signed)
Transcranial Doppler  Date POD PCO2 HCT BP  MCA ACA PCA OPHT SIPH VERT Basilar  6/20 JH 1    Right  Left   64  45   *  *   33  31   21  16   20   *   -28  -27   -24      6/21 MS 2 27.6 26 116/ 62 Right  Left   71  *   -24  *   51  *   9  21   *  31   *  *   *      6/23 RH 4  27.2 135/86 Right  Left   93  113   -40  -22   42  35   17  22   41  53   -32  -33   -35      6/26 MS 7   220/83 Right  Left   107  126   -51  -69   52  52   14  17   69  43   -16  -32   -30      6/28 RH 9   174/ 89 Right  Left   101  126   -36  -29   30  29   19   34   71  70   -47  -34   -35           Right  Left                                            Right  Left                                        MCA = Middle Cerebral Artery      OPHT = Opthalmic Artery     BASILAR = Basilar Artery   ACA = Anterior Cerebral Artery     SIPH = Carotid Siphon PCA = Posterior Cerebral Artery   VERT = Verterbral Artery                   Normal MCA = 62+\-12 ACA = 50+\-12 PCA = 42+\-23   *=Unable to insonate  [place] Ratio: RT = 4.04, LT = 5.73  7/28, RDMS, RVT

## 2022-03-28 NOTE — Progress Notes (Signed)
Nutrition Follow-up  DOCUMENTATION CODES:   Not applicable  INTERVENTION:   Initiate tube feeding via Cortrak tube: Osmolite 1.5 at 60 ml/h (1440 ml per day) Prosource TF 45 ml TID  Provides 2280 kcal, 123 gm protein, 1097 ml free water daily.  NUTRITION DIAGNOSIS:   Inadequate oral intake related to inability to eat as evidenced by NPO status.  GOAL:   Patient will meet greater than or equal to 90% of their needs  MONITOR:   Vent status, TF tolerance, Labs  REASON FOR ASSESSMENT:   Consult Enteral/tube feeding initiation and management, Assessment of nutrition requirement/status  ASSESSMENT:   41 yo female admitted with L frontal ICH r/t aneurysm. PMH includes HTN, asthma/COPD, tobacco abuse, egg allergy.  Pt discussed during ICU rounds and with RN and MD.  Per RN unable to wean to TC, requiring increased fiO2.   6/19 - S/P aneurysm clipping 6/20 - self extubated 6/21 - re-intubated; cortrak placed tip in gastric antrum  6/26 s/p trach   Labs reviewed. K 3.3 CBG: 106-130  Medications reviewed and include Colace, ferrous sulfate, bidil, nimotop, protonix, Novolog, Miralax, prednisone Precedex  Labs reviewed: Na 146 CBG's: 116-150    Diet Order:   Diet Order             Diet NPO time specified  Diet effective now                   EDUCATION NEEDS:   No education needs have been identified at this time  Skin:  Skin Assessment: Reviewed RN Assessment  Last BM:  220 ml via rectal tube  Height:   Ht Readings from Last 1 Encounters:  03/27/22 5\' 6"  (1.676 m)    Weight:   Wt Readings from Last 1 Encounters:  03/28/22 125.2 kg    Ideal Body Weight:  59.1 kg  BMI:  Body mass index is 44.55 kg/m.  Estimated Nutritional Needs:   Kcal:  2100-2300  Protein:  120-130 gm  Fluid:  2.1-2.3 L  Willa Brocks P., RD, LDN, CNSC See AMiON for contact information

## 2022-03-28 NOTE — Progress Notes (Signed)
NAME:  Rhonda Delgado, MRN:  [id number], DOB:  [birth year], LOS: 10 ADMISSION DATE:  11-Apr-2022, CONSULTATION DATE:  04-11-22 REFERRING MD:  [facility] EDP CHIEF COMPLAINT:   ICH  History of Present Illness:  41 yo female presented to [facility] on April 11, 2022 with altered mental status, vomiting and hypertension.  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  Transferred to [facility] for neurosurgery assessment, and taken to OR on 6/19 for clipping of aneurysm.  PCCM consulted to assist with management in ICU.  Pertinent Medical History:  Asthma, COPD, HTN  Significant Hospital Events: Including procedures, antibiotic start and stop dates in addition to other pertinent events   6/18 transferred from [facility] to [facility] for L frontal ICH, intubated. NSGY consulted. L pupil 83mm/dilated, then overnight improved to 62mm/reactive. Once again 98mm on 0200 assessment. 6/19 L pupil improved, 51mm/reactive. To OR for aneurysm clipping. Self-extubated prior to OR, went to OR and reintubated for case. Extubated at the end of the case. 6/20 Remains hypertensive. Intermittently agitated with stimulation, otherwise somnolent. Precedex started. Maintaining sats on NRB with transition to [facility]. 6/21 Ongoing permissive hypertension to SBP 160. Intermittently agitated on Precedex. More somnolent, Provigil started. Sats marginal on HHFNC, FiO2 100%. Low threshold for reintubation. Reintubated in afternoon  6/22 worse Cxr. Started [facility] pred + SCH BD, + 1x lasix 80. Helpted facilitate FiO2 wean  6/23 CXR a little better. 40% FiO2. Changing unasyn to rocephin 2/2 Na bump to 146  6/25 changed to pressure control; pharmacy dosing IV iron 6/26 tracheostomy 6/27 start pressure support weaning  Interim History / Subjective:  Tolerated pressure support for about 5 hrs on 6/27.  C/o feeling more short of breath with increased RR with PS 8/5 this AM.  Objective:  Blood pressure (!) 165/89, pulse 69, temperature 100.3 F (37.9 C),  temperature source Axillary, resp. rate (!) 29, height 5\' 6"  (1.676 m), weight 125.2 kg, SpO2 100 %.    Vent Mode: PRVC FiO2 (%):  [30 %-40 %] 30 % Set Rate:  [22 bmp] 22 bmp Vt Set:  [470 mL] 470 mL PEEP:  [5 cmH20] 5 cmH20 Pressure Support:  [5 cmH20] 5 cmH20 Plateau Pressure:  [13 cmH20-15 cmH20] 15 cmH20   Intake/Output Summary (Last 24 hours) at 03/28/2022 0848 Last data filed at 03/28/2022 0800 Gross per 24 hour  Intake 2167.94 ml  Output 3970 ml  Net -1802.06 ml   Filed Weights   03/26/22 0500 03/27/22 0400 03/28/22 0500  Weight: 123.4 kg 122.5 kg 125.2 kg   Physical Examination:    General - sleepy Eyes - pupils reactive ENT - trach site clean Cardiac - regular rate/rhythm, no murmur Chest - scattered rhonchi Abdomen - soft, non tender, + bowel sounds Extremities -1+ edema Skin - no rashes Neuro - follows commands  Resolved Hospital Problem List   AKI, Aspiration pneumonia, Acute pulmonary edema  Assessment & Plan:   Compromised airway in setting of SAH. Reported hx of COPD with acute exacerbation. Failure to wean s/p tracheostomy 6/26. 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 - post op care per neurosurgery -  provigil to promote wakefulness - f/u TCDs - continue nimitop  Anxiety. - add klonopin 0.5 mg bid and zoloft 25 mg daily  HTN emergency. - goal BP normotensive - continue bidil, cozaar  Iron deficiency anemia. - f/u CBC - transfuse for Hb < 7 - IV iron dosed per pharmacy 6/25 and 6/26  Steroid induced hyperglycemia. - SSI  Deconditioning. - will need PT/OT  Best Practice: (right click and "Reselect all SmartList Selections" daily)    Diet/type: tubefeeds DVT prophylaxis: SCD GI prophylaxis: PPI Lines: Arterial Line Foley:  Yes, and it is still needed Code Status:  full code Last date of multidisciplinary goals of care discussion [6/18 Sister Selena Batten updated at the beside]   Labs:      Latest Ref Rng & Units 03/28/2022   12:28 AM 03/27/2022    5:59 AM 03/26/2022    5:25 PM  CMP  Glucose 70 - 99 mg/dL 756  433    BUN 6 - 20 mg/dL 23  24    Creatinine 2.95 - 1.00 mg/dL 1.88  4.16    Sodium 606 - 145 mmol/L 146  144  148   Potassium 3.5 - 5.1 mmol/L 4.0  3.8  4.3   Chloride 98 - 111 mmol/L 116  117    CO2 22 - 32 mmol/L 23  22    Calcium 8.9 - 10.3 mg/dL 8.9  8.7         Latest Ref Rng & Units 03/27/2022    5:59 AM 03/26/2022    5:25 PM 03/26/2022    5:19 AM  CBC  WBC 4.0 - 10.5 K/uL 16.8   15.7   Hemoglobin 12.0 - 15.0 g/dL 7.0  7.8  7.5   Hematocrit 36.0 - 46.0 % 24.8  23.0  26.1   Platelets 150 - 400 K/uL 233   243     ABG    Component Value Date/Time   PHART 7.412 03/26/2022 1725   PCO2ART 38.6 03/26/2022 1725   PO2ART 160 (H) 03/26/2022 1725   HCO3 24.4 03/26/2022 1725   TCO2 26 03/26/2022 1725   ACIDBASEDEF 8.0 (H) 03/21/2022 1256   O2SAT 99 03/26/2022 1725    CBG (last 3)  Recent Labs    03/28/22 0009 03/28/22 0401 03/28/22 0746  GLUCAP 133* 116* 135*    Iron/TIBC/Ferritin/ %Sat    Component Value Date/Time   IRON 17 (L) 03/25/2022 0553   TIBC 344 03/25/2022 0553   FERRITIN 13 03/25/2022 0553   IRONPCTSAT 5 (L) 03/25/2022 0553    Signature:  Coralyn Helling, MD [place] Pulmonary/Critical Care Pager - [phone] 03/28/2022, 8:48 AM

## 2022-03-28 NOTE — Progress Notes (Signed)
OT Cancellation Note  Patient Details Name: RAYETTE MOGG MRN: [id number] DOB: 1981-08-28   Cancelled Treatment:    Reason Eval/Treat Not Completed: Patient not medically ready (RN reporting pt is maxed out on precedex and has unstable respiratory status with inability to wean on the vent currently. Coordinated with RN for OT to sign off at this time and to re-consult pt becomes more appropriate for therapy at a later date.)  Lelon Mast A Clerence Gubser 03/28/2022, 2:34 PM

## 2022-03-28 NOTE — Progress Notes (Signed)
SLP Cancellation Note  Patient Details Name: Rhonda Delgado MRN: [id number] DOB: [birth year]   Cancelled treatment:       Reason Eval/Treat Not Completed: Patient not medically ready (Pt remains on the vent at this time and RN reports that it is unlikely that pt will transition to trach collar today. SLP will follow up on a subsequent date.)  Keval Nam I. Vear Clock, MS, CCC-SLP Acute Rehabilitation Services Office number [phone] Pager [phone]  Scheryl Marten 03/28/2022, 10:16 AM

## 2022-03-28 NOTE — Progress Notes (Signed)
PT Cancellation & Discharge Note  Patient Details Name: Rhonda Delgado MRN: [id number] DOB: 08-Apr-1981   Cancelled Treatment:    Reason Eval/Treat Not Completed: Medical issues which prohibited therapy. RN reporting pt is maxed out on precedex and has unstable respiratory status with inability to wean on the vent currently. Coordinated with RN for PT to sign off at this time and to re-consult PT if pt becomes more appropriate for therapy at a later date.   Raymond Gurney, PT, DPT Acute Rehabilitation Services  Office: [phone]   Jewel Baize 03/28/2022, 2:17 PM

## 2022-03-29 ENCOUNTER — Inpatient Hospital Stay (HOSPITAL_COMMUNITY): Payer: Medicaid Other

## 2022-03-29 LAB — BASIC METABOLIC PANEL
Anion gap: 6 (ref 5–15)
BUN: 24 mg/dL — ABNORMAL HIGH (ref 6–20)
CO2: 21 mmol/L — ABNORMAL LOW (ref 22–32)
Calcium: 8.8 mg/dL — ABNORMAL LOW (ref 8.9–10.3)
Chloride: 117 mmol/L — ABNORMAL HIGH (ref 98–111)
Creatinine, Ser: 0.73 mg/dL (ref 0.44–1.00)
GFR, Estimated: >60 mL/min (ref >60)
Glucose, Bld: 113 mg/dL — ABNORMAL HIGH (ref 70–99)
Potassium: 4.1 mmol/L (ref 3.5–5.1)
Sodium: 144 mmol/L (ref 135–145)

## 2022-03-29 LAB — CBC
HCT: 26.3 % — ABNORMAL LOW (ref 36.0–46.0)
Hemoglobin: 7.4 g/dL — ABNORMAL LOW (ref 12.0–15.0)
MCH: 23.1 pg — ABNORMAL LOW (ref 26.0–34.0)
MCHC: 28.1 g/dL — ABNORMAL LOW (ref 30.0–36.0)
MCV: 82.2 fL (ref 80.0–100.0)
Platelets: 252 10*3/uL (ref 150–400)
RBC: 3.2 MIL/uL — ABNORMAL LOW (ref 3.87–5.11)
RDW: 19.8 % — ABNORMAL HIGH (ref 11.5–15.5)
WBC: 20.1 10*3/uL — ABNORMAL HIGH (ref 4.0–10.5)
nRBC: 1.2 % — ABNORMAL HIGH (ref 0.0–0.2)

## 2022-03-29 LAB — GLUCOSE, CAPILLARY
Glucose-Capillary: 120 mg/dL — ABNORMAL HIGH (ref 70–99)
Glucose-Capillary: 123 mg/dL — ABNORMAL HIGH (ref 70–99)
Glucose-Capillary: 125 mg/dL — ABNORMAL HIGH (ref 70–99)
Glucose-Capillary: 126 mg/dL — ABNORMAL HIGH (ref 70–99)
Glucose-Capillary: 129 mg/dL — ABNORMAL HIGH (ref 70–99)
Glucose-Capillary: 131 mg/dL — ABNORMAL HIGH (ref 70–99)
Glucose-Capillary: 149 mg/dL — ABNORMAL HIGH (ref 70–99)

## 2022-03-29 MED ORDER — QUETIAPINE FUMARATE 25 MG PO TABS
25.0000 mg | ORAL_TABLET | Freq: Every day | ORAL | Status: DC
  Administered 2022-03-29 – 2022-03-31 (×3): 25 mg
  Filled 2022-03-29 (×2): qty 1

## 2022-03-29 MED ORDER — CLONIDINE HCL 0.1 MG PO TABS: 0.1000 mg | ORAL_TABLET | Freq: Every day | ORAL | Status: DC

## 2022-03-29 MED ORDER — QUETIAPINE FUMARATE 25 MG PO TABS
25.0000 mg | ORAL_TABLET | Freq: Every day | ORAL | Status: DC
  Filled 2022-03-29: qty 1

## 2022-03-29 MED ORDER — CLONAZEPAM 1 MG PO TABS
1.0000 mg | ORAL_TABLET | Freq: Two times a day (BID) | ORAL | Status: DC
  Administered 2022-03-29 – 2022-03-31 (×5): 1 mg
  Filled 2022-03-29 (×5): qty 1

## 2022-03-29 MED ORDER — CLONIDINE HCL 0.1 MG PO TABS: 0.1000 mg | ORAL_TABLET | Freq: Four times a day (QID) | ORAL | Status: DC

## 2022-03-29 MED ORDER — CLONIDINE HCL 0.1 MG PO TABS: 0.1000 mg | ORAL_TABLET | ORAL | Status: DC

## 2022-03-29 MED ORDER — CLONIDINE HCL 0.1 MG PO TABS
0.1000 mg | ORAL_TABLET | Freq: Four times a day (QID) | ORAL | Status: AC
  Administered 2022-03-29 – 2022-03-31 (×8): 0.1 mg
  Filled 2022-03-29 (×8): qty 1

## 2022-03-29 MED ORDER — CLONIDINE HCL 0.1 MG PO TABS
0.1000 mg | ORAL_TABLET | ORAL | Status: DC
  Administered 2022-03-31: 0.1 mg
  Filled 2022-03-29: qty 1

## 2022-03-29 NOTE — Progress Notes (Signed)
SLP Cancellation Note  Patient Details Name: Rhonda Delgado MRN: [id number] DOB: 1980/11/01   Cancelled treatment:       Reason Eval/Treat Not Completed: Patient not medically ready (Pt remains on the vent, but weaning has been initiated. SLP will follow up.)  Shriyan Arakawa I. Vear Clock, MS, CCC-SLP Acute Rehabilitation Services Office number [phone] Pager [phone]  Scheryl Marten 03/29/2022, 1:33 PM

## 2022-03-29 NOTE — Progress Notes (Signed)
Called to room by RN for pt desaturating and low tidal volumes on vent. Pt able to cough around trach and difficult to bag per RN. Upon arrival to room, sutures removed from trach and trach removed/repositioned by RT. Trach possibly dislodged/coughed into false track. Positive color change on ETCO2 detector and bilateral breath sounds heard. SpO2 quickly rose to 100%. Pt tolerated well. New split gauze applied as well as new trach ties at this time. A #8 Proximal XLT trach placed at pt bedside as well. RT will continue to monitor and be available as needed.

## 2022-03-29 NOTE — Progress Notes (Signed)
Patient's trach became partially dislodged during care. Suture site intact, but unable to advance inline cath or bag via trach. Patient labored, desatted to low 80's, able to talk and cough over trach. RT at bedside, cuff deflated and non-rebreather placed. Patient's sats increased to 90's immediately. Sutures removed by RT, and trach removed and repositioned with positive color change. Placed back on vent with sats increasing to 100% on previous vent settings. RT recommending #8 proximal XLT trach be placed at bedside. CCM aware of above. Continue with #8 shiley at present time, will obtain CXR. Patient resting comfortably at this time, Spo2 96%

## 2022-03-29 NOTE — Progress Notes (Signed)
Pt remains with a #8 Shiley in place.

## 2022-03-29 NOTE — Progress Notes (Signed)
Still needing precedex to control anxiety.  Anxiety is impacting her ability to wean on vent.  Will add clonidine taper, increased klonopin to 1 mg bid, and add seroquel 25 mg qhs.  Coralyn Helling, MD [facility] Pulmonary/Critical Care Pager - [phone] 03/29/2022, 11:35 AM

## 2022-03-29 NOTE — Progress Notes (Signed)
  NEUROSURGERY PROGRESS NOTE   Pt seen and examined. No issues overnight. Able to wean for a few hours, became tachypneic requiring sedation again.  EXAM: Temp:  [98.2 F (36.8 C)-100 F (37.8 C)] 99.1 F (37.3 C) (06/29 1539) Pulse Rate:  [54-87] 82 (06/29 1700) Resp:  [22-39] 29 (06/29 1700) BP: (132-175)/(60-91) 148/91 (06/29 1700) SpO2:  [81 %-100 %] 97 % (06/29 1700) FiO2 (%):  [30 %-50 %] 40 % (06/29 1505) Weight:  [128.5 kg] 128.5 kg (06/29 0300) Intake/Output      06/28 0701 06/29 0700 06/29 0701 06/30 0700   I.V. (mL/kg) 781.3 (6.1) 304.3 (2.4)   NG/GT 1380 660   Total Intake(mL/kg) 2161.3 (16.8) 964.3 (7.5)   Urine (mL/kg/hr) 2300 (0.7) 900 (0.7)   Stool 50    Total Output 2350 900   Net -188.7 +64.3         Off sedation: Eyes open spontaneously Pupils 72mm, reactive OU Follows commands in BUE W/d ble Wound c/d/I  LABS: Lab Results  Component Value Date   CREATININE 0.73 03/29/2022   BUN 24 (H) 03/29/2022   NA 144 03/29/2022   K 4.1 03/29/2022   CL 117 (H) 03/29/2022   CO2 21 (L) 03/29/2022   Lab Results  Component Value Date   WBC 20.1 (H) 03/29/2022   HGB 7.4 (L) 03/29/2022   HCT 26.3 (L) 03/29/2022   MCV 82.2 03/29/2022   PLT 252 03/29/2022   TCD: Date POD PCO2 HCT BP   MCA ACA PCA OPHT SIPH VERT Basilar  6/20 JH 1       Right  Left   64  45   *  *   33  31   21  16   20   *   -28  -27   -24       6/21 MS 2 27.6 26 116/ 62 Right  Left   71  *   -24  *   51  *   9  21   *  31   *  *   *       6/23 RH 4   27.2 135/86 Right  Left   93  113   -40  -22   42  35   17  22   41  53   -32  -33   -35       6/26 MS 7     220/83 Right  Left   107  126   -51  -69   52  52   14  17   69  43   -16  -32   -30       6/28 RH 9     174/ 89 Right  Left   101  126   -36  -29   30  29   19   34   71  70   -47  -34   -35         IMPRESSION: - 41 y.o. female SAHd# 11 POD# 10 s/p  clipping LMCA aneurysm. Appears neurologically stable. - VDRF - poor pulmonary mechanics, appears to be slowly improving but still requires sedation when on vent support. - HTN - TCD largely stable, not indicative of spasm  PLAN: - Vent mgmt per PCCM - SBP up to 200s mmHg - Cont Nimotop - TCD tomorrow   , MD [facility]

## 2022-03-29 NOTE — Progress Notes (Signed)
NAME:  Rhonda Delgado, MRN:  [id number], DOB:  10/02/1980, LOS: 11 ADMISSION DATE:  04/15/2022, CONSULTATION DATE:  2022/04/15 REFERRING MD:  [facility] EDP CHIEF COMPLAINT:   ICH  History of Present Illness:  41 yo female presented to [facility] on 04-15-22 with altered mental status, vomiting and hypertension.  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  Transferred to [facility] for neurosurgery assessment, and taken to OR on 6/19 for clipping of aneurysm.  PCCM consulted to assist with management in ICU.  Pertinent Medical History:  Asthma, COPD, HTN  Significant Hospital Events: Including procedures, antibiotic start and stop dates in addition to other pertinent events   6/18 transferred from [facility] to [facility] for L frontal ICH, intubated. NSGY consulted. L pupil 60mm/dilated, then overnight improved to 63mm/reactive. Once again 24mm on 0200 assessment. 6/19 L pupil improved, 73mm/reactive. To OR for aneurysm clipping. Self-extubated prior to OR, went to OR and reintubated for case. Extubated at the end of the case. 6/20 Remains hypertensive. Intermittently agitated with stimulation, otherwise somnolent. Precedex started. Maintaining sats on NRB with transition to [facility]. 6/21 Ongoing permissive hypertension to SBP 160. Intermittently agitated on Precedex. More somnolent, Provigil started. Sats marginal on HHFNC, FiO2 100%. Low threshold for reintubation. Reintubated in afternoon  6/22 worse Cxr. Started [facility] pred + SCH BD, + 1x lasix 80. Helpted facilitate FiO2 wean  6/23 CXR a little better. 40% FiO2. Changing unasyn to rocephin 2/2 Na bump to 146  6/25 changed to pressure control; pharmacy dosing IV iron 6/26 tracheostomy 6/27 start pressure support weaning  Interim History / Subjective:  Doing better with PS this morning.    Objective:  Blood pressure (!) 149/85, pulse (!) 57, temperature 99 F (37.2 C), temperature source Axillary, resp. rate (!) 26, height 5\' 6"  (1.676 m), weight 128.5  kg, SpO2 100 %.    Vent Mode: PSV;CPAP FiO2 (%):  [30 %-50 %] 40 % Set Rate:  [22 bmp] 22 bmp Vt Set:  [470 mL] 470 mL PEEP:  [5 cmH20] 5 cmH20 Pressure Support:  [12 cmH20] 12 cmH20 Plateau Pressure:  [16 cmH20-21 cmH20] 16 cmH20   Intake/Output Summary (Last 24 hours) at 03/29/2022 0857 Last data filed at 03/29/2022 0600 Gross per 24 hour  Intake 2072 ml  Output 2350 ml  Net -278 ml   Filed Weights   03/27/22 0400 03/28/22 0500 03/29/22 0300  Weight: 122.5 kg 125.2 kg 128.5 kg   Physical Examination:    General - sleepy, breathing more comfortable Eyes - pupils reactive ENT - trach site clean Cardiac - regular rate/rhythm, no murmur Chest - equal breath sounds b/l, no wheezing or rales Abdomen - soft, non tender, + bowel sounds Extremities - no cyanosis, clubbing, or edema Skin - no rashes Neuro - follows simple commands   Resolved Hospital Problem List   AKI, Aspiration pneumonia, Acute pulmonary edema, COPD exacerbation  Assessment & Plan:   Compromised airway in setting of SAH. Reported hx of COPD. Failure to wean s/p tracheostomy 6/26. 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 - post op care per neurosurgery - provigil to promote wakefulness - f/u TCDs - continue nimitop  Anxiety. - added klonopin  0.5 mg bid and zoloft 25 mg daily on 6/28  HTN emergency. - goal BP normotensive - continue bidil, cozaar  Iron deficiency anemia. - f/u CBC intermittently - transfuse for Hb < 7 - IV iron dosed per pharmacy 6/25 and 6/26  Steroid induced hyperglycemia. - SSI  Deconditioning. - will need PT/OT  Best Practice: (right click and "Reselect all SmartList Selections" daily)   Diet/type: tubefeeds DVT  prophylaxis: SCD GI prophylaxis: PPI Lines: Arterial Line Foley:  Yes, and it is still needed Code Status:  full code Last date of multidisciplinary goals of care discussion [Updated sister at bedside]  Labs:      Latest Ref Rng & Units 03/29/2022    4:20 AM 03/28/2022   12:28 AM 03/27/2022    5:59 AM  CMP  Glucose 70 - 99 mg/dL 297  989  211   BUN 6 - 20 mg/dL 24  23  24    Creatinine 0.44 - 1.00 mg/dL  9.41  7.40   Sodium 135 - 145 mmol/L 144  146  144   Potassium 3.5 - 5.1 mmol/L 4.1  4.0  3.8   Chloride 98 - 111 mmol/L 117  116  117   CO2 22 - 32 mmol/L 21  23  22    Calcium 8.9 - 10.3 mg/dL 8.8  8.9  8.7        Latest Ref Rng & Units 03/29/2022    4:20 AM 03/27/2022    5:59 AM 03/26/2022    5:25 PM  CBC  WBC 4.0 - 10.5 K/uL 20.1  16.8    Hemoglobin 12.0 - 15.0 g/dL 7.4  7.0  7.8   Hematocrit 36.0 - 46.0 % 26.3  24.8  23.0   Platelets 150 - 400 K/uL 252  233      ABG    Component Value Date/Time   PHART 7.412 03/26/2022 1725   PCO2ART 38.6 03/26/2022 1725   PO2ART 160 (H) 03/26/2022 1725   HCO3 24.4 03/26/2022 1725   TCO2 26 03/26/2022 1725   ACIDBASEDEF 8.0 (H) 03/21/2022 1256   O2SAT 99 03/26/2022 1725    CBG (last 3)  Recent Labs    03/29/22 0107 03/29/22 0350 03/29/22 0755  GLUCAP 120* 131* 149*    Iron/TIBC/Ferritin/ %Sat    Component Value Date/Time   IRON 17 (L) 03/25/2022 0553   TIBC 344 03/25/2022 0553   FERRITIN 13 03/25/2022 0553   IRONPCTSAT 5 (L) 03/25/2022 0553    Signature:  03/27/2022, MD [place] Pulmonary/Critical Care Pager - [phone] 03/29/2022, 8:57 AM

## 2022-03-30 ENCOUNTER — Inpatient Hospital Stay (HOSPITAL_COMMUNITY): Payer: Medicaid Other

## 2022-03-30 DIAGNOSIS — I609 Nontraumatic subarachnoid hemorrhage, unspecified: Secondary | ICD-10-CM

## 2022-03-30 DIAGNOSIS — I10 Essential (primary) hypertension: Secondary | ICD-10-CM

## 2022-03-30 DIAGNOSIS — Z8679 Personal history of other diseases of the circulatory system: Secondary | ICD-10-CM

## 2022-03-30 LAB — GLUCOSE, CAPILLARY: Glucose-Capillary: 123 mg/dL — ABNORMAL HIGH (ref 70–99)

## 2022-03-30 MED ORDER — ORAL CARE MOUTH RINSE: 15.0000 mL | OROMUCOSAL | Status: DC | PRN

## 2022-03-30 MED ORDER — ORAL CARE MOUTH RINSE
15.0000 mL | OROMUCOSAL | Status: DC
  Administered 2022-03-30 – 2022-03-31 (×7): 15 mL via OROMUCOSAL

## 2022-03-30 NOTE — Progress Notes (Signed)
SLP Cancellation Note  Patient Details Name: Rhonda Delgado MRN: [id number] DOB: March 06, 1981   Cancelled treatment:       Reason Eval/Treat Not Completed: Patient not medically ready (Pt remains on the vent. SLP will follow up next week.)  Syrenity Klepacki I. Vear Clock, MS, CCC-SLP Acute Rehabilitation Services Office number [phone] Pager [phone]  Scheryl Marten 03/30/2022, 12:59 PM

## 2022-03-30 NOTE — Progress Notes (Signed)
  NEUROSURGERY PROGRESS NOTE   Pt seen and examined. No issues overnight. Had trach replaced.  EXAM: Temp:  [99.1 F (37.3 C)-100.2 F (37.9 C)] 100.1 F (37.8 C) (06/30 0800) Pulse Rate:  [54-87] 64 (06/30 1000) Resp:  [21-39] 26 (06/30 1000) BP: (125-175)/(47-91) 127/47 (06/30 1000) SpO2:  [81 %-100 %] 91 % (06/30 1000) FiO2 (%):  [40 %] 40 % (06/30 0810) Weight:  [128.5 kg] 128.5 kg (06/30 0323) Intake/Output      06/29 0701 06/30 0700 06/30 0701 07/01 0700   I.V. (mL/kg) 649.7 (5.1)    NG/GT 1440    Total Intake(mL/kg) 2089.7 (16.3)    Urine (mL/kg/hr) 1700 (0.6)    Stool 550    Total Output 2250    Net -160.3          Off sedation: Attempts to open eyes to voice Pupils 23mm, reactive OU Follows commands in BUE Flicker of movement BLE Wound c/d/i  LABS: Lab Results  Component Value Date   CREATININE 0.73 03/29/2022   BUN 24 (H) 03/29/2022   NA 144 03/29/2022   K 4.1 03/29/2022   CL 117 (H) 03/29/2022   CO2 21 (L) 03/29/2022   Lab Results  Component Value Date   WBC 20.1 (H) 03/29/2022   HGB 7.4 (L) 03/29/2022   HCT 26.3 (L) 03/29/2022   MCV 82.2 03/29/2022   PLT 252 03/29/2022   TCD: Date POD PCO2 HCT BP   MCA ACA PCA OPHT SIPH VERT Basilar  6/20 JH 1       Right  Left   64  45   *  *   33  31   21  16   20   *   -28  -27   -24       6/21 MS 2 27.6 26 116/ 62 Right  Left   71  *   -24  *   51  *   9  21   *  31   *  *   *       6/23 RH 4   27.2 135/86 Right  Left   93  113   -40  -22   42  35   17  22   41  53   -32  -33   -35       6/26 MS 7     220/83 Right  Left   107  126   -51  -69   52  52   14  17   69  43   -16  -32   -30       6/28 RH 9     174/ 89 Right  Left   101  126   -36  -29   30  29   19   34   71  70   -47  -34   -35         IMPRESSION: - 41 y.o. female SAHd# 12 POD# 11 s/p clipping LMCA aneurysm. Appears neurologically stable, somewhat decreased lower  extremity responses but ACA velocities on TCD thus far have remained WNL. - VDRF - poor pulmonary mechanics, appears to be slowly improving but still requires sedation when on vent support. - HTN   PLAN: - Vent mgmt per PCCM - Cont Nimotop - TCD today. If increased velocities in ACA territories, could consider CTA/CTP   , MD [facility]

## 2022-03-30 NOTE — Progress Notes (Signed)
Transcranial Doppler  Date POD PCO2 HCT BP  MCA ACA PCA OPHT SIPH VERT Basilar  6/20 JH 1    Right  Left   64  45   *  *   33  31   21  16   20   *   -28  -27   -24      6/21 MS 2 27.6 26 116/ 62 Right  Left   71  *   -24  *   51  *   9  21   *  31   *  *   *      6/23 RH 4  27.2 135/86 Right  Left   93  113   -40  -22   42  35   17  22   41  53   -32  -33   -35      6/26 MS 7   220/83 Right  Left   107  126   -51  -69   52  52   14  17   69  43   -16  -32   -30      6/28 RH 9   174/ 89 Right  Left   101  126   -36  -29   30  29   19   34   71  70   -47  -34   -35      6/30 RH 11   121/ 48 Right  Left   82  109   -28  -37   32  53   11  23   36  64   -17  -32   -24           Right  Left                                        MCA = Middle Cerebral Artery      OPHT = Opthalmic Artery     BASILAR = Basilar Artery   ACA = Anterior Cerebral Artery     SIPH = Carotid Siphon PCA = Posterior Cerebral Artery   VERT = Verterbral Artery                   Normal MCA = 62+\-12 ACA = 50+\-12 PCA = 42+\-23   *=Unable to insonate  [place] Ratio: RT = 3.73, LT = 5.19  7/28, RDMS, RVT

## 2022-03-30 NOTE — Progress Notes (Signed)
NAME:  Rhonda Delgado, MRN:  [id number], DOB:  Nov 27, 1980, LOS: 12 ADMISSION DATE:  03/28/2022, CONSULTATION DATE:  03/13/2022 REFERRING MD:  [facility] EDP CHIEF COMPLAINT:   ICH  History of Present Illness:  41 yo female presented to [facility] on 03/09/2022 with altered mental status, vomiting and hypertension.  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  Transferred to [facility] for neurosurgery assessment, and taken to OR on 6/19 for clipping of aneurysm.  PCCM consulted to assist with management in ICU.  Pertinent Medical History:  Asthma, COPD, HTN  Significant Hospital Events: Including procedures, antibiotic start and stop dates in addition to other pertinent events   6/18 transferred from [facility] to [facility] for L frontal ICH, intubated. NSGY consulted. L pupil 57mm/dilated, then overnight improved to 57mm/reactive. Once again 72mm on 0200 assessment. 6/19 L pupil improved, 68mm/reactive. To OR for aneurysm clipping. Self-extubated prior to OR, went to OR and reintubated for case. Extubated at the end of the case. 6/20 Remains hypertensive. Intermittently agitated with stimulation, otherwise somnolent. Precedex started. Maintaining sats on NRB with transition to [facility]. 6/21 Ongoing permissive hypertension to SBP 160. Intermittently agitated on Precedex. More somnolent, Provigil started. Sats marginal on HHFNC, FiO2 100%. Low threshold for reintubation. Reintubated in afternoon  6/22 worse Cxr. Started [facility] pred + SCH BD, + 1x lasix 80. Helpted facilitate FiO2 wean  6/23 CXR a little better. 40% FiO2. Changing unasyn to rocephin 2/2 Na bump to 146  6/25 changed to pressure control; pharmacy dosing IV iron 6/26 tracheostomy 6/27 start pressure support weaning  Interim History / Subjective:  Doing better with PS this morning.    Objective:  Blood pressure 132/81, pulse (!) 55, temperature 99.3 F (37.4 C), temperature source Oral, resp. rate (!) 23, height 5\' 6"  (1.676 m), weight 128.5 kg, SpO2  98 %.    Vent Mode: PRVC FiO2 (%):  [40 %] 40 % Set Rate:  [22 bmp] 22 bmp Vt Set:  [470 mL] 470 mL PEEP:  [5 cmH20] 5 cmH20 Pressure Support:  [12 cmH20] 12 cmH20 Plateau Pressure:  [16 cmH20-18 cmH20] 18 cmH20   Intake/Output Summary (Last 24 hours) at 03/30/2022 0656 Last data filed at 03/30/2022 0600 Gross per 24 hour  Intake 2089.71 ml  Output 2250 ml  Net -160.29 ml    Filed Weights   03/28/22 0500 03/29/22 0300 03/30/22 0323  Weight: 125.2 kg 128.5 kg 128.5 kg   Physical Examination:    Sleeping on full support Staple line looks CDI across cranium 04/18/2022 looks CDI without much secretions Lungs are clear Not triggering vent Not waking up for me on current precedex but per nursing with enough prompting will follow commands  No labs today: will add for tomorrrow   Resolved Hospital Problem List   AKI, Aspiration pneumonia, Acute pulmonary edema, COPD exacerbation  Assessment & Plan:   Compromised airway in setting of SAH. Reported hx of COPD. Unusual for this age. Failure to wean s/p tracheostomy 6/26. 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 - post op care per neurosurgery - provigil to promote wakefulness - f/u TCDs: planned for today - continue nimitop  Anxiety. - added klonopin 0.5 mg bid  and zoloft 25 mg daily on 6/28, wean from precedex  HTN emergency. - goal BP normotensive - continue bidil, cozaar - At goal  Iron deficiency anemia. - f/u CBC intermittently - transfuse for Hb < 7 - IV iron dosed per pharmacy 6/25 and 6/26  Steroid induced hyperglycemia. - Not an issue anymore, DC  Deconditioning. - will need PT/OT once able to participate consistently - Likely will need SNF  Dysphagia- will  discuss PEG with family when I meet them  Best Practice: (right click and "Reselect all SmartList Selections" daily)   Diet/type: tubefeeds DVT prophylaxis: SCD, unclear when cleared for DVT ppx, will d/w pharmD GI prophylaxis: PPI Lines: Arterial Line Foley:  Yes, and it is still needed Code Status:  full code Last date of multidisciplinary goals of care discussion [6/29]  32 min cc time titrating precedex, vent Myrla Halsted MD PCCM
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
## 2022-03-31 LAB — CBC
HCT: 26.4 % — ABNORMAL LOW (ref 36.0–46.0)
Hemoglobin: 7.7 g/dL — ABNORMAL LOW (ref 12.0–15.0)
MCH: 24.1 pg — ABNORMAL LOW (ref 26.0–34.0)
MCHC: 29.2 g/dL — ABNORMAL LOW (ref 30.0–36.0)
MCV: 82.5 fL (ref 80.0–100.0)
Platelets: 259 10*3/uL (ref 150–400)
RBC: 3.2 MIL/uL — ABNORMAL LOW (ref 3.87–5.11)
RDW: 21.6 % — ABNORMAL HIGH (ref 11.5–15.5)
WBC: 17.8 10*3/uL — ABNORMAL HIGH (ref 4.0–10.5)
nRBC: 0.7 % — ABNORMAL HIGH (ref 0.0–0.2)

## 2022-03-31 LAB — BASIC METABOLIC PANEL
Anion gap: 3 — ABNORMAL LOW (ref 5–15)
BUN: 23 mg/dL — ABNORMAL HIGH (ref 6–20)
CO2: 24 mmol/L (ref 22–32)
Calcium: 8.8 mg/dL — ABNORMAL LOW (ref 8.9–10.3)
Chloride: 115 mmol/L — ABNORMAL HIGH (ref 98–111)
Creatinine, Ser: 0.63 mg/dL (ref 0.44–1.00)
GFR, Estimated: >60 mL/min (ref >60)
Glucose, Bld: 158 mg/dL — ABNORMAL HIGH (ref 70–99)
Potassium: 3.8 mmol/L (ref 3.5–5.1)
Sodium: 142 mmol/L (ref 135–145)

## 2022-03-31 LAB — PHOSPHORUS: Phosphorus: 3.4 mg/dL (ref 2.5–4.6)

## 2022-03-31 LAB — GLUCOSE, CAPILLARY: Glucose-Capillary: 154 mg/dL — ABNORMAL HIGH (ref 70–99)

## 2022-03-31 LAB — MAGNESIUM: Magnesium: 2.3 mg/dL (ref 1.7–2.4)

## 2022-03-31 MED ORDER — METOLAZONE 2.5 MG PO TABS
2.5000 mg | ORAL_TABLET | Freq: Once | ORAL | Status: DC
Start: 2022-03-31 — End: 2022-03-31
  Filled 2022-03-31: qty 1

## 2022-03-31 MED ORDER — PROPOFOL 1000 MG/100ML IV EMUL: 5.0000 ug/kg/min | INTRAVENOUS | Status: DC

## 2022-03-31 MED ORDER — OXYCODONE HCL 5 MG PO TABS
10.0000 mg | ORAL_TABLET | Freq: Four times a day (QID) | ORAL | Status: DC
  Administered 2022-03-31 – 2022-04-01 (×4): 10 mg
  Filled 2022-03-31 (×4): qty 2

## 2022-03-31 MED ORDER — OXYCODONE HCL 5 MG PO TABS
10.0000 mg | ORAL_TABLET | Freq: Four times a day (QID) | ORAL | Status: DC
Start: 2022-03-31 — End: 2022-03-31

## 2022-03-31 MED ORDER — FUROSEMIDE 10 MG/ML IJ SOLN
40.0000 mg | Freq: Three times a day (TID) | INTRAMUSCULAR | Status: DC
  Administered 2022-03-31 – 2022-04-01 (×4): 40 mg via INTRAVENOUS
  Filled 2022-03-31 (×4): qty 4

## 2022-03-31 MED ORDER — NUTRISOURCE FIBER PO PACK
1.0000 | PACK | Freq: Two times a day (BID) | ORAL | Status: DC
  Administered 2022-03-31 (×2): 1
  Filled 2022-03-31 (×2): qty 1

## 2022-03-31 MED ORDER — METOLAZONE 2.5 MG PO TABS
2.5000 mg | ORAL_TABLET | Freq: Once | ORAL | Status: AC
Start: 2022-03-31 — End: 2022-03-31
  Administered 2022-03-31: 2.5 mg
  Filled 2022-03-31: qty 1

## 2022-03-31 MED ORDER — POTASSIUM CHLORIDE 20 MEQ PO PACK
40.0000 meq | PACK | Freq: Two times a day (BID) | ORAL | Status: AC
  Administered 2022-03-31 (×2): 40 meq
  Filled 2022-03-31 (×2): qty 2

## 2022-03-31 MED ORDER — ALBUMIN HUMAN 25 % IV SOLN
25.0000 g | Freq: Four times a day (QID) | INTRAVENOUS | Status: AC
  Administered 2022-03-31 – 2022-04-01 (×4): 25 g via INTRAVENOUS
  Filled 2022-03-31 (×4): qty 100

## 2022-03-31 NOTE — Progress Notes (Signed)
Subjective: NAEs o/n  Objective: Vital signs in last 24 hours: Temp:  [98.6 F (37 C)-100 F (37.8 C)] 99 F (37.2 C) (07/01 0800) Pulse Rate:  [55-106] 95 (07/01 1000) Resp:  [22-35] 35 (07/01 1000) BP: (121-186)/(47-91) 123/47 (07/01 1000) SpO2:  [93 %-100 %] 100 % (07/01 1000) FiO2 (%):  [40 %] 40 % (07/01 0801) Weight:  [127.1 kg] 127.1 kg (07/01 0500)  Intake/Output from previous day: 06/30 0701 - 07/01 0700 In: 2114.9 [I.V.:674.9; NG/GT:1440] Out: 805 [Urine:725; Stool:80] Intake/Output this shift: Total I/O In: 309 [I.V.:61; NG/GT:180; IV Piggyback:67.9] Out: 1000 [Urine:1000] Eyes open to stim FC in Ues, minimal w/d in lowers Scalp incision c/d  Lab Results: Recent Labs    03/29/22 0420 03/31/22 0311  WBC 20.1* 17.8*  HGB 7.4* 7.7*  HCT 26.3* 26.4*  PLT 252 259   BMET Recent Labs    03/29/22 0420 03/31/22 0311  NA 144 142  K 4.1 3.8  CL 117* 115*  CO2 21* 24  GLUCOSE 113* 158*  BUN 24* 23*  CREATININE 0.73 0.63  CALCIUM 8.8* 8.8*    Studies/Results: VAS [place] TRANSCRANIAL DOPPLER  Result Date: 03/30/2022  Transcranial Doppler Patient Name:  Rhonda Delgado  Date of Exam:   03/30/2022 Medical Rec #: [id number]        Accession #:    [id number] Date of Birth: 1980/12/18        Patient Gender: F Patient Age:   41 years Exam Location:  [facility] Procedure:      VAS [place] TRANSCRANIAL DOPPLER Referring Phys: Rhonda Delgado --------------------------------------------------------------------------------  Indications: Subarachnoid hemorrhage. History: S/P Left side craniotomy, aneurysm clipping, left side hematoma evacuation (03/27/2022). Comparison Study: 03-28-2022 Most recent prior study. Performing Technologist: Rhonda Delgado RDMS, RVT  Examination Guidelines: A complete evaluation includes B-mode imaging, spectral Doppler, color Doppler, and power Doppler as needed of all accessible portions of each vessel. Bilateral testing is considered an integral part of a  complete examination. Limited examinations for reoccurring indications may be performed as noted.  +----------+-------------+----------+-----------+-------+ RIGHT TCD Right VM (cm)Depth (cm)PulsatilityComment +----------+-------------+----------+-----------+-------+ MCA           82.00       5.60      1.23            +----------+-------------+----------+-----------+-------+ ACA          -28.00                 1.35            +----------+-------------+----------+-----------+-------+ Term ICA      59.00                 1.30            +----------+-------------+----------+-----------+-------+ PCA           32.00                 1.18            +----------+-------------+----------+-----------+-------+ Opthalmic     11.00                 1.27            +----------+-------------+----------+-----------+-------+ ICA siphon    36.00                 1.10            +----------+-------------+----------+-----------+-------+ Vertebral    -17.00  1.01            +----------+-------------+----------+-----------+-------+ Distal ICA    22.00                 1.29            +----------+-------------+----------+-----------+-------+  +----------+------------+----------+-----------+-------+ LEFT TCD  Left VM (cm)Depth (cm)PulsatilityComment +----------+------------+----------+-----------+-------+ MCA          109.00      5.50      1.16            +----------+------------+----------+-----------+-------+ ACA          -37.00                1.15            +----------+------------+----------+-----------+-------+ Term ICA     53.00                 1.39            +----------+------------+----------+-----------+-------+ PCA          53.00                 1.27            +----------+------------+----------+-----------+-------+ Opthalmic    23.00                 1.90            +----------+------------+----------+-----------+-------+ ICA  siphon   64.00                 1.26            +----------+------------+----------+-----------+-------+ Vertebral    -32.00                1.05            +----------+------------+----------+-----------+-------+ Distal ICA   21.00                 1.40            +----------+------------+----------+-----------+-------+  +------------+------+-------+             VM cm Comment +------------+------+-------+ Prox Basilar-24.00        +------------+------+-------+ Dist Basilar-23.00        +------------+------+-------+ +----------------------+----+ Right Lindegaard Ratio3.73 +----------------------+----+ +---------------------+----+ Left Lindegaard Ratio5.19 +---------------------+----+  Summary:  mildly Elevated bilateral middle cerebral artery mean flow velocities, left mroe than right, improved from 2 days ago, no significant vasospasm. Normal mean flow velocities in remaining identified vessels of anterior and posterior cerebral circulation. normal flow directions in all vessels. However, diffusely elevated pulsatility indexes indicates increased intracranial pressure. clinial correlation is recommended. *See table(s) above for TCD measurements and observations.  Diagnosing physician: Rhonda Plan MD Electronically signed by Rhonda Plan MD on 03/30/2022 at 5:35:48 PM.    Final    DG Chest Port 1 View  Result Date: 03/29/2022 CLINICAL DATA:  Trach placement. EXAM: PORTABLE CHEST 1 VIEW COMPARISON:  11/28/2021 FINDINGS: Tracheostomy in stable position.  Feeding catheter also noted. The cardiac silhouette is enlarged. Mild interstitial pulmonary edema. Low lung volumes.  Sub pleural effusions cannot be excluded Osseous structures are without acute abnormality. Soft tissues are grossly normal. IMPRESSION: 1. Cardiomegaly with mild interstitial pulmonary edema. 2. Low lung volumes. Sub pleural effusions cannot be excluded. Electronically Signed   By: Rhonda Delgado M.D.   On:  03/29/2022 19:18    Assessment/Delgado: S/p clipping of ruptured Mca aneurysm.  Has had vasospasm treated with HHH, but some improvement on most recent TCD  yesterday - nimodipine - balance of care per PCCM   Rhonda Delgado 03/31/2022, 10:38 AM

## 2022-03-31 NOTE — Progress Notes (Signed)
Pt failed wean this am due to increased anxiety. Pt resting calmly at this time and is currently not tachypneic or tachycardic, therefore re attempting wean trail 10/5.

## 2022-03-31 NOTE — Progress Notes (Signed)
NAME:  Rhonda Delgado, MRN:  [id number], DOB:  [birth year], LOS: 13 ADMISSION DATE:  03/12/2022, CONSULTATION DATE:  03/22/2022 REFERRING MD:  [facility] EDP CHIEF COMPLAINT:   ICH  History of Present Illness:  41 yo female presented to [facility] on 03/02/2022 with altered mental status, vomiting and hypertension.  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  Transferred to [facility] for neurosurgery assessment, and taken to OR on 6/19 for clipping of aneurysm.  PCCM consulted to assist with management in ICU.  Pertinent Medical History:  Asthma, COPD, HTN  Significant Hospital Events: Including procedures, antibiotic start and stop dates in addition to other pertinent events   6/18 transferred from [facility] to [facility] for L frontal ICH, intubated. NSGY consulted. L pupil 29mm/dilated, then overnight improved to 34mm/reactive. Once again 11mm on 0200 assessment. 6/19 L pupil improved, 59mm/reactive. To OR for aneurysm clipping. Self-extubated prior to OR, went to OR and reintubated for case. Extubated at the end of the case. 6/20 Remains hypertensive. Intermittently agitated with stimulation, otherwise somnolent. Precedex started. Maintaining sats on NRB with transition to [facility]. 6/21 Ongoing permissive hypertension to SBP 160. Intermittently agitated on Precedex. More somnolent, Provigil started. Sats marginal on HHFNC, FiO2 100%. Low threshold for reintubation. Reintubated in afternoon  6/22 worse Cxr. Started [facility] pred + SCH BD, + 1x lasix 80. Helpted facilitate FiO2 wean  6/23 CXR a little better. 40% FiO2. Changing unasyn to rocephin 2/2 Na bump to 146  6/25 changed to pressure control; pharmacy dosing IV iron 6/26 tracheostomy 6/27 start pressure support weaning  Interim History / Subjective:  No events.  Tolerated PS x 1.5 hours yesterday.  Objective:  Blood pressure (!) 146/89, pulse (!) 55, temperature 98.6 F (37 C), temperature source Oral, resp. rate (!) 22, height 5\' 6"  (1.676 m), weight  127.1 kg, SpO2 100 %.    Vent Mode: PRVC FiO2 (%):  [40 %] 40 % Set Rate:  [22 bmp] 22 bmp Vt Set:  [470 mL] 470 mL PEEP:  [5 cmH20] 5 cmH20 Pressure Support:  [12 cmH20] 12 cmH20 Plateau Pressure:  [18 cmH20] 18 cmH20   Intake/Output Summary (Last 24 hours) at 03/31/2022 0739 Last data filed at 03/31/2022 0700 Gross per 24 hour  Intake 2114.92 ml  Output 805 ml  Net 1309.92 ml    Filed Weights   03/29/22 0300 03/30/22 0323 03/31/22 0500  Weight: 128.5 kg 128.5 kg 127.1 kg   Physical Examination:    Sleeping on full support Staple line looks CDI across cranium 06/01/22 looks CDI some small bloody secretions Lungs are diminished at bases Not triggering vent Good cough, moving uppers to command with enough prompting  Stable anemia on labs Cr okay   Resolved Hospital Problem List   AKI, Aspiration pneumonia, Acute pulmonary edema, COPD exacerbation, Steroid induced hyperglycemia.  Assessment & Plan:   Compromised airway in setting of SAH. Reported hx of COPD. Unusual for this age. Failure to wean s/p tracheostomy 6/26. - PS as tolerated, will discuss with RT - goal SpO2 > 92% - f/u CXR intermittently - continue brovana, yupelri, pulmicort - prn albuterol - f/u with speech therapy - d/c trach sutures after July 3 - Try to wean from precedex as able- derecruits unfortunately when trying.  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 - post op care per neurosurgery - provigil to  promote wakefulness - continue nimitop  Anxiety. - klonopin 0.5 mg bid and zoloft 25 mg daily on 6/28, wean from precedex; klonipin increased 6/30, started on clonidine taper as well to help with this  HTN emergency. - goal BP normotensive - continue bidil, cozaar - At goal  Iron deficiency anemia. - f/u CBC intermittently - transfuse for Hb < 7 - IV iron dosed per pharmacy 6/25  and 6/26   Deconditioning. - will need PT/OT once able to participate consistently - Likely will need SNF  Dysphagia- probably will need PEG  Best Practice: (right click and "Reselect all SmartList Selections" daily)   Diet/type: tubefeeds DVT prophylaxis: SCD, unclear when cleared for DVT ppx, will d/w pharmD GI prophylaxis: PPI Lines: Arterial Line Foley:  Yes, and it is still needed Code Status:  full code Last date of multidisciplinary goals of care discussion [6/29]  35 min cc time titrating precedex, vent Myrla Halsted MD PCCM

## 2022-03-31 DEATH — deceased

## 2022-04-01 DIAGNOSIS — I469 Cardiac arrest, cause unspecified: Secondary | ICD-10-CM

## 2022-04-01 LAB — CBC
HCT: 27.2 % — ABNORMAL LOW (ref 36.0–46.0)
Hemoglobin: 7.9 g/dL — ABNORMAL LOW (ref 12.0–15.0)
MCH: 23.9 pg — ABNORMAL LOW (ref 26.0–34.0)
MCHC: 29 g/dL — ABNORMAL LOW (ref 30.0–36.0)
MCV: 82.4 fL (ref 80.0–100.0)
Platelets: 264 10*3/uL (ref 150–400)
RBC: 3.3 MIL/uL — ABNORMAL LOW (ref 3.87–5.11)
RDW: 22.2 % — ABNORMAL HIGH (ref 11.5–15.5)
WBC: 15.7 10*3/uL — ABNORMAL HIGH (ref 4.0–10.5)
nRBC: 0.1 % (ref 0.0–0.2)

## 2022-04-01 LAB — POCT I-STAT 7, (LYTES, BLD GAS, ICA,H+H)
Acid-Base Excess: 7 mmol/L — ABNORMAL HIGH (ref 0.0–2.0)
Bicarbonate: 32.5 mmol/L — ABNORMAL HIGH (ref 20.0–28.0)
Calcium, Ion: 1.36 mmol/L (ref 1.15–1.40)
HCT: 25 % — ABNORMAL LOW (ref 36.0–46.0)
Hemoglobin: 8.5 g/dL — ABNORMAL LOW (ref 12.0–15.0)
O2 Saturation: 100 %
Patient temperature: 98.9
Potassium: 3.3 mmol/L — ABNORMAL LOW (ref 3.5–5.1)
Sodium: 141 mmol/L (ref 135–145)
TCO2: 34 mmol/L — ABNORMAL HIGH (ref 22–32)
pCO2 arterial: 51.7 mmHg — ABNORMAL HIGH (ref 32–48)
pH, Arterial: 7.408 (ref 7.35–7.45)
pO2, Arterial: 356 mmHg — ABNORMAL HIGH (ref 83–108)

## 2022-04-01 LAB — PHOSPHORUS: Phosphorus: 4.7 mg/dL — ABNORMAL HIGH (ref 2.5–4.6)

## 2022-04-01 LAB — MAGNESIUM: Magnesium: 2.4 mg/dL (ref 1.7–2.4)

## 2022-04-01 LAB — BASIC METABOLIC PANEL
Anion gap: 10 (ref 5–15)
BUN: 22 mg/dL — ABNORMAL HIGH (ref 6–20)
CO2: 26 mmol/L (ref 22–32)
Calcium: 10 mg/dL (ref 8.9–10.3)
Chloride: 103 mmol/L (ref 98–111)
Creatinine, Ser: 0.81 mg/dL (ref 0.44–1.00)
GFR, Estimated: >60 mL/min (ref >60)
Glucose, Bld: 177 mg/dL — ABNORMAL HIGH (ref 70–99)
Potassium: 3.4 mmol/L — ABNORMAL LOW (ref 3.5–5.1)
Sodium: 139 mmol/L (ref 135–145)

## 2022-04-01 LAB — TRIGLYCERIDES: Triglycerides: 138 mg/dL (ref <150)

## 2022-04-01 MED ORDER — ETOMIDATE 2 MG/ML IV SOLN
20.0000 mg | Freq: Once | INTRAVENOUS | Status: AC
Start: 2022-04-01 — End: 2022-04-01

## 2022-04-01 MED ORDER — ROCURONIUM BROMIDE 50 MG/5ML IV SOLN
80.0000 mg | Freq: Once | INTRAVENOUS | Status: AC
  Filled 2022-04-01: qty 8

## 2022-04-01 MED ORDER — ETOMIDATE 2 MG/ML IV SOLN
INTRAVENOUS | Status: AC
  Filled 2022-04-01: qty 10

## 2022-04-01 MED ORDER — ROCURONIUM BROMIDE 10 MG/ML (PF) SYRINGE
PREFILLED_SYRINGE | INTRAVENOUS | Status: AC
  Filled 2022-04-01: qty 10

## 2022-04-01 MED ORDER — MIDAZOLAM HCL 2 MG/2ML IJ SOLN
INTRAMUSCULAR | Status: AC
  Administered 2022-04-01: 2 mg
  Filled 2022-04-01: qty 2

## 2022-04-30 MED FILL — Medication: Qty: 1 | Status: AC

## 2022-05-01 NOTE — Progress Notes (Addendum)
eLink Physician-Brief Progress Note Patient Name: Rhonda Delgado DOB: 02/21/1981 MRN: [id number]   Date of Service  04/07/2022  HPI/Events of Note  Notified that there was a persistent trache cuff leak.  VTe measures only RT has tried inflating cuff, securing it with ties, looking for alternative source of leak in the ventilatory tubing but none can be found.  CO2 detector on tracheostomy with mininal color change.  Despite this, pt hemodynamically stable, appears comfortable, saturating  100%, no subcutaneous emphysema noted.   eICU Interventions  Will check ABG.  May need to replace tracheostomy tube.         Vikkie Goeden M DELA CRUZ 04/04/2022, 4:54 AM  5:28 AM Replaced tube with similar size shiley 8.  Leak slightly smaller ( leak from ), but persistent.  After this, RT had tried to insert an XLT tube, but couldn't advance it fully.  Placed the regular length tracheostomy tube. Will continue to monitor closely.

## 2022-05-01 NOTE — Death Summary Note (Signed)
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  Transferred to [facility] for neurosurgery assessment, and taken to OR on 6/19 for clipping of aneurysm.  PCCM consulted to assist with management in ICU.  03/20/23 transferred from [facility] to [facility] for L frontal ICH, intubated. NSGY consulted. L pupil 29mm/dilated, then overnight improved to 48mm/reactive. Once again 5mm on 0200 assessment. 6/19 L pupil improved, 66mm/reactive. To OR for aneurysm clipping. Self-extubated prior to OR, went to OR and reintubated for case. Extubated at the end of the case. 6/20 Remains hypertensive. Intermittently agitated with stimulation, otherwise somnolent. Precedex started. Maintaining sats on NRB with transition to [facility]. 6/21 Ongoing permissive hypertension to SBP 160. Intermittently agitated on Precedex. More somnolent, Provigil started. Sats marginal on HHFNC, FiO2 100%. Low threshold for reintubation. Reintubated in afternoon  6/22 worse Cxr. Started [facility] pred + SCH BD, + 1x lasix 80. Helpted facilitate FiO2 wean  6/23 CXR a little better. 40% FiO2. Changing unasyn to rocephin 2/2 Na bump to 146  6/25 changed to pressure control; pharmacy dosing IV iron 6/26 tracheostomy 6/27 start pressure support  weaning  Continued to have poor ventilator wean tolerance.  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  Pertinent Labs and Studies  Significant Diagnostic Studies VAS [place] TRANSCRANIAL DOPPLER  Result Date: 03/30/2022  Transcranial Doppler Patient Name:  Rhonda Delgado  Date of Exam:   03/30/2022 Medical Rec #: [id number]        Accession #:    [id number] Date of Birth: Jan 25, 1981        Patient Gender: F Patient Age:   77 years Exam Location:  [facility] Procedure:      VAS [place] TRANSCRANIAL DOPPLER Referring Phys: Ronaldo Miyamoto CABBELL --------------------------------------------------------------------------------  Indications: Subarachnoid hemorrhage. History: S/P Left side craniotomy, aneurysm clipping, left side hematoma evacuation (03/10/2022). Comparison Study: 03-28-2022 Most recent prior study. Performing Technologist: Jean Rosenthal RDMS, RVT  Examination Guidelines: A complete evaluation includes B-mode imaging, spectral Doppler, color Doppler, and power Doppler as needed of all accessible portions of each vessel. Bilateral testing is considered an integral part of a complete examination. Limited examinations for reoccurring indications may be performed as noted.  +----------+-------------+----------+-----------+-------+ RIGHT TCD Right VM (cm)Depth (cm)PulsatilityComment +----------+-------------+----------+-----------+-------+ MCA           82.00       5.60      1.23            +----------+-------------+----------+-----------+-------+ ACA          -28.00                 1.35            +----------+-------------+----------+-----------+-------+ Term ICA      59.00  1.30            +----------+-------------+----------+-----------+-------+ PCA           32.00                 1.18            +----------+-------------+----------+-----------+-------+ Opthalmic     11.00                  1.27            +----------+-------------+----------+-----------+-------+ ICA siphon    36.00                 1.10            +----------+-------------+----------+-----------+-------+ Vertebral    -17.00                 1.01            +----------+-------------+----------+-----------+-------+ Distal ICA    22.00                 1.29            +----------+-------------+----------+-----------+-------+  +----------+------------+----------+-----------+-------+ LEFT TCD  Left VM (cm)Depth (cm)PulsatilityComment +----------+------------+----------+-----------+-------+ MCA          109.00      5.50      1.16            +----------+------------+----------+-----------+-------+ ACA          -37.00                1.15            +----------+------------+----------+-----------+-------+ Term ICA     53.00                 1.39            +----------+------------+----------+-----------+-------+ PCA          53.00                 1.27            +----------+------------+----------+-----------+-------+ Opthalmic    23.00                 1.90            +----------+------------+----------+-----------+-------+ ICA siphon   64.00                 1.26            +----------+------------+----------+-----------+-------+ Vertebral    -32.00                1.05            +----------+------------+----------+-----------+-------+ Distal ICA   21.00                 1.40            +----------+------------+----------+-----------+-------+  +------------+------+-------+             VM cm Comment +------------+------+-------+ Prox Basilar-24.00        +------------+------+-------+ Dist Basilar-23.00        +------------+------+-------+ +----------------------+----+ Right Lindegaard Ratio3.73 +----------------------+----+ +---------------------+----+ Left Lindegaard Ratio5.19 +---------------------+----+  Summary:  mildly Elevated bilateral  middle cerebral artery mean flow velocities, left mroe than right, improved from 2 days ago, no significant vasospasm. Normal mean flow velocities in remaining identified vessels of anterior and posterior cerebral circulation. normal flow directions in all vessels. However, diffusely elevated pulsatility indexes indicates increased intracranial pressure. clinial correlation is recommended. *See table(s) above for TCD  measurements and observations.  Diagnosing physician: Marvel Plan MD Electronically signed by Marvel Plan MD on 03/30/2022 at 5:35:48 PM.    Final    DG Chest Port 1 View  Result Date: 03/29/2022 CLINICAL DATA:  Trach placement. EXAM: PORTABLE CHEST 1 VIEW COMPARISON:  11/28/2021 FINDINGS: Tracheostomy in stable position.  Feeding catheter also noted. The cardiac silhouette is enlarged. Mild interstitial pulmonary edema. Low lung volumes.  Sub pleural effusions cannot be excluded Osseous structures are without acute abnormality. Soft tissues are grossly normal. IMPRESSION: 1. Cardiomegaly with mild interstitial pulmonary edema. 2. Low lung volumes. Sub pleural effusions cannot be excluded. Electronically Signed   By: Ted Mcalpine M.D.   On: 03/29/2022 19:18   VAS [place] TRANSCRANIAL DOPPLER  Result Date: 03/28/2022  Transcranial Doppler Patient Name:  MACKAYLA MULLINS  Date of Exam:   03/28/2022 Medical Rec #: [id number]        Accession #:    [id number] Date of Birth: 07/27/1981        Patient Gender: F Patient Age:   68 years Exam Location:  [facility] Procedure:      VAS [place] TRANSCRANIAL DOPPLER Referring Phys: Ronaldo Miyamoto CABBELL --------------------------------------------------------------------------------  Indications: Subarachnoid hemorrhage. History: S/P Left side craniotomy, aneurysm clipping, left side hematoma evacuation (03/08/2022). Comparison Study: 03-26-2022 Most recent prior study. Performing Technologist: Jean Rosenthal RDMS, RVT  Examination Guidelines: A complete evaluation  includes B-mode imaging, spectral Doppler, color Doppler, and power Doppler as needed of all accessible portions of each vessel. Bilateral testing is considered an integral part of a complete examination. Limited examinations for reoccurring indications may be performed as noted.  +----------+-------------+----------+-----------+-------+ RIGHT TCD Right VM (cm)Depth (cm)PulsatilityComment +----------+-------------+----------+-----------+-------+ MCA          101.00       5.60      1.28            +----------+-------------+----------+-----------+-------+ ACA          -36.00                 1.46            +----------+-------------+----------+-----------+-------+ Term ICA      70.00                 1.32            +----------+-------------+----------+-----------+-------+ PCA           30.00                 1.41            +----------+-------------+----------+-----------+-------+ Opthalmic     19.00                 1.71            +----------+-------------+----------+-----------+-------+ ICA siphon    71.00                 1.26            +----------+-------------+----------+-----------+-------+ Vertebral    -47.00                 1.51            +----------+-------------+----------+-----------+-------+ Distal ICA    25.00                 1.55            +----------+-------------+----------+-----------+-------+  +----------+------------+----------+-----------+-------+ LEFT TCD  Left VM (cm)Depth (cm)PulsatilityComment +----------+------------+----------+-----------+-------+ MCA  126.00                1.18            +----------+------------+----------+-----------+-------+ ACA          -29.00                1.37            +----------+------------+----------+-----------+-------+ Term ICA     57.00                 1.46            +----------+------------+----------+-----------+-------+ PCA          29.00                 1.26             +----------+------------+----------+-----------+-------+ Opthalmic    34.00                 1.63            +----------+------------+----------+-----------+-------+ ICA siphon   70.00                 1.08            +----------+------------+----------+-----------+-------+ Vertebral    -34.00                1.33            +----------+------------+----------+-----------+-------+ Distal ICA   22.00                 1.50            +----------+------------+----------+-----------+-------+  +------------+------+-------+             VM cm Comment +------------+------+-------+ Prox Basilar-35.00        +------------+------+-------+ Dist Basilar-34.00        +------------+------+-------+ +----------------------+----+ Right Lindegaard Ratio4.04 +----------------------+----+ +---------------------+----+ Left Lindegaard Ratio5.73 +---------------------+----+  Summary:  mildly Elevated bilateral middle cerebral artery mean flow velocities suggest mild vasospasm. Normal mean flow velocities in majority of remaining identified vessels of anterior and posterior cerebral circulation. normal flow directions in all vessels *See table(s) above for TCD measurements and observations.  Diagnosing physician: Marvel Plan MD Electronically signed by Marvel Plan MD on 03/28/2022 at 6:21:14 PM.    Final    VAS [place] TRANSCRANIAL DOPPLER  Result Date: 03/28/2022  Transcranial Doppler Patient Name:  SHERRELLE PROCHAZKA  Date of Exam:   03/26/2022 Medical Rec #: [id number]        Accession #:    [id number] Date of Birth: 20-Jun-1981        Patient Gender: F Patient Age:   78 years Exam Location:  [facility] Procedure:      VAS [place] TRANSCRANIAL DOPPLER Referring Phys: Ronaldo Miyamoto CABBELL --------------------------------------------------------------------------------  Indications: Subarachnoid hemorrhage. History: S/P Left side craniotomy, aneurysm clipping, left side hematoma evacuation (2022-03-24). Comparison  Study: 03/23/22 TCD Performing Technologist: Gertie Fey MHA, RDMS, RVT, RDCS  Examination Guidelines: A complete evaluation includes B-mode imaging, spectral Doppler, color Doppler, and power Doppler as needed of all accessible portions of each vessel. Bilateral testing is considered an integral part of a complete examination. Limited examinations for reoccurring indications may be performed as noted.  +----------+-------------+----------+-----------+-------+ RIGHT TCD Right VM (cm)Depth (cm)PulsatilityComment +----------+-------------+----------+-----------+-------+ MCA          107.00       5.70      1.13            +----------+-------------+----------+-----------+-------+ ACA          -  51.00                 1.04            +----------+-------------+----------+-----------+-------+ Term ICA     128.00                 0.79            +----------+-------------+----------+-----------+-------+ PCA           52.00                 0.72            +----------+-------------+----------+-----------+-------+ Opthalmic     14.00                 0.86            +----------+-------------+----------+-----------+-------+ ICA siphon    69.00                 1.06            +----------+-------------+----------+-----------+-------+ Vertebral    -16.00                 0.80            +----------+-------------+----------+-----------+-------+ Distal ICA    29.00                                 +----------+-------------+----------+-----------+-------+  +----------+------------+----------+-----------+-------+ LEFT TCD  Left VM (cm)Depth (cm)PulsatilityComment +----------+------------+----------+-----------+-------+ MCA          126.00      6.00      0.98            +----------+------------+----------+-----------+-------+ ACA          -69.00                0.79            +----------+------------+----------+-----------+-------+ Term ICA     49.00                  0.96            +----------+------------+----------+-----------+-------+ PCA          52.00                 1.01            +----------+------------+----------+-----------+-------+ Opthalmic    17.00                 1.53            +----------+------------+----------+-----------+-------+ ICA siphon   43.00                 1.26            +----------+------------+----------+-----------+-------+ Vertebral    -32.00                0.97            +----------+------------+----------+-----------+-------+ Distal ICA   31.00                                 +----------+------------+----------+-----------+-------+  +------------+------+-------+             VM cm Comment +------------+------+-------+ Prox Basilar-30.00        +------------+------+-------+ +----------------------+----+ Right Lindegaard Ratio3.69 +----------------------+----+ +---------------------+----+ Left Lindegaard Ratio4.06 +---------------------+----+  Summary:  mildly elevated bilateral middle cerebral and teriminal right internal  carotid artery mean flow velocities suggest mild vasospasm. Normal mean flow velocities in majority of remaining identified vessels of anterior and posterior cerebral circulation. normal flow directions *See table(s) above for TCD measurements and observations.  Diagnosing physician: Marvel PlanJindong Xu MD Electronically signed by Marvel PlanJindong Xu MD on 03/28/2022 at 6:19:50 PM.    Final    VAS [place] TRANSCRANIAL DOPPLER  Result Date: 03/28/2022  Transcranial Doppler Patient Name:  Michaelle CopasCLEZELL S Elman  Date of Exam:   03/23/2022 Medical Rec #: [id number]        Accession #:    [id number] Date of Birth: 20-May-1981        Patient Gender: F Patient Age:   [age] years [facility] Procedure:      VAS [place] TRANSCRANIAL DOPPLER Referring Phys: Ronaldo MiyamotoKYLE CABBELL --------------------------------------------------------------------------------  Indications: Subarachnoid hemorrhage. History: S/P  Left side craniotomy, aneurysm clipping, left side hematoma evacuation (03/25/2022). Comparison Study: Most recent prior on 03-21-2022. Performing Technologist: Jean Rosenthalachel Hodge RDMS, RVT  Examination Guidelines: A complete evaluation includes B-mode imaging, spectral Doppler, color Doppler, and power Doppler as needed of all accessible portions of each vessel. Bilateral testing is considered an integral part of a complete examination. Limited examinations for reoccurring indications may be performed as noted.  +----------+-------------+----------+-----------+-------+ RIGHT TCD Right VM (cm)Depth (cm)PulsatilityComment +----------+-------------+----------+-----------+-------+ MCA           93.00       5.70      0.90            +----------+-------------+----------+-----------+-------+ ACA          -40.00                 0.69            +----------+-------------+----------+-----------+-------+ Term ICA      73.00       6.60      1.11            +----------+-------------+----------+-----------+-------+ PCA           42.00                 1.01            +----------+-------------+----------+-----------+-------+ Opthalmic     17.00                 1.25            +----------+-------------+----------+-----------+-------+ ICA siphon    41.00                 1.81            +----------+-------------+----------+-----------+-------+ Vertebral    -32.00                 0.96            +----------+-------------+----------+-----------+-------+ Distal ICA    21.00                 1.03            +----------+-------------+----------+-----------+-------+  +----------+------------+----------+-----------+-------+ LEFT TCD  Left VM (cm)Depth (cm)PulsatilityComment +----------+------------+----------+-----------+-------+ MCA          113.00      5.00      0.94            +----------+------------+----------+-----------+-------+ ACA          -22.00                0.88             +----------+------------+----------+-----------+-------+  Term ICA     50.00                 1.10            +----------+------------+----------+-----------+-------+ PCA          35.00                 0.99            +----------+------------+----------+-----------+-------+ Opthalmic    22.00                 1.81            +----------+------------+----------+-----------+-------+ ICA siphon   53.00                 1.20            +----------+------------+----------+-----------+-------+ Vertebral    -33.00                0.97            +----------+------------+----------+-----------+-------+ Distal ICA   19.00                 0.85            +----------+------------+----------+-----------+-------+  +------------+------+-------+             VM cm Comment +------------+------+-------+ Prox Basilar-35.00        +------------+------+-------+ Dist Basilar-31.00        +------------+------+-------+ +----------------------+----+ Right Lindegaard Ratio4.43 +----------------------+----+ +---------------------+----+ Left Lindegaard Ratio5.95 +---------------------+----+  Summary:  mildly elevated bilateral middle cerebral and teriminal right internal carotid artery mean flow velocities, no significant vasospams suggested. Normal mean flow velocities in majority of remaining identified vessels of anterior and posterior cerebral circulation. normal flow directions. *See table(s) above for TCD measurements and observations.  Diagnosing physician: Marvel Plan MD Electronically signed by Marvel Plan MD on 03/28/2022 at 6:18:44 PM.    Final    DG Chest Port 1 View  Result Date: 03/28/2022 CLINICAL DATA:  41 year old female with history of respiratory distress. EXAM: PORTABLE CHEST - 1 VIEW COMPARISON:  03/27/2022 FINDINGS: The mediastinal contours are within normal limits. Unchanged cardiomegaly. Tracheostomy cannula remains in place. Enteric feeding tube courses off the  inferior aspect of this image. Unchanged left greater than right peribronchovascular mild hazy opacities. No evidence of significant pleural effusion or pneumothorax. No acute osseous abnormality. IMPRESSION: 1. Unchanged mild left greater than right hazy opacities, favored to represent asymmetric pulmonary edema. 2. Unchanged cardiomegaly. Electronically Signed   By: Marliss Coots M.D.   On: 03/28/2022 09:14   DG Chest Port 1 View  Result Date: 03/27/2022 CLINICAL DATA:  Respiratory failure EXAM: PORTABLE CHEST 1 VIEW COMPARISON:  03/26/2022 FINDINGS: Tracheostomy tube remains in place. Enteric tube courses below the diaphragm with distal tip beyond the inferior margin of the film. Stable cardiomegaly. Increased interstitial markings bilaterally. Small right pleural effusion. No pneumothorax. IMPRESSION: Increased interstitial markings bilaterally, may represent interstitial edema. Small right pleural effusion. Electronically Signed   By: Duanne Guess D.O.   On: 03/27/2022 08:15   DG Chest Port 1 View  Result Date: 03/26/2022 CLINICAL DATA:  Tracheostomy. EXAM: PORTABLE CHEST 1 VIEW COMPARISON:  Chest x-ray 03/21/2022 FINDINGS: There is a new tracheostomy with distal tip 3 cm above the carina. Enteric tube extends below the diaphragm. The heart is enlarged, unchanged. Small right pleural effusion persists with minimal right basilar opacities. There is no evidence for pneumothorax. Osseous structures are within normal limits. IMPRESSION: 1.  New tracheostomy with distal tip 3 cm above carina. 2. Stable small right pleural effusion with right basilar atelectasis. Stable cardiomegaly. Electronically Signed   By: Darliss Cheney M.D.   On: 03/26/2022 15:53   DG CHEST PORT 1 VIEW  Result Date: 03/24/2022 CLINICAL DATA:  Aspiration pneumonia EXAM: PORTABLE CHEST 1 VIEW COMPARISON:  March 23, 2022 FINDINGS: An ETT terminates in good position, partially obscured by the feeding tube. The feeding tube terminates  below today's film. No pneumothorax. No nodules or masses. Mild opacity in the right base. No other infiltrates. No overt edema. IMPRESSION: 1. Support apparatus as above. 2. Mild opacity in the right base could represent atelectasis, aspiration, or pneumonia. Recommend attention on follow-up. Electronically Signed   By: Gerome Sam III M.D.   On: 03/24/2022 09:33   DG CHEST PORT 1 VIEW  Result Date: 03/23/2022 CLINICAL DATA:  Aspiration. EXAM: PORTABLE CHEST 1 VIEW COMPARISON:  Chest radiograph 03/22/2022 FINDINGS: Endotracheal tube terminates approximately 4 cm above the carina. Feeding tube courses into the abdomen with tip not imaged. The cardiac silhouette remains enlarged. There is mild elevation of the right hemidiaphragm. Diffuse interstitial opacities have mildly improved. Small pleural effusions have also likely decreased. No pneumothorax is identified. IMPRESSION: Mildly improved appearance of the chest suggesting improved edema. Electronically Signed   By: Sebastian Ache M.D.   On: 03/23/2022 08:17   ECHOCARDIOGRAM COMPLETE  Result Date: 03/22/2022    ECHOCARDIOGRAM REPORT   Patient Name:   ROSELINE EBARB Date of Exam: 03/22/2022 Medical Rec #:  [id number]       Height:       66.0 in Accession #:    [id number]      Weight:       280.0 lb Date of Birth:  04/13/1981       BSA:          2.307 m Patient Age:    41 years        BP:           154/92 mmHg Patient Gender: F               HR:           61 bpm. Exam Location:  Inpatient Procedure: 2D Echo Indications:    stroke  History:        Patient has no prior history of Echocardiogram examinations.                 COPD; Risk Factors:Current Smoker and Hypertension.  Sonographer:    Delcie Roch RDCS Referring Phys: [id number] GRACE E BOWSER  Sonographer Comments: Echo performed with patient supine and on artificial respirator. Image acquisition challenging due to respiratory motion. IMPRESSIONS  1. Elevated LVOT gradient (2.3 m/s) likely due to  hyperdynamic LV function.  2. Left ventricular ejection fraction, by estimation, is >75%. The left ventricle has hyperdynamic function. The left ventricle has no regional wall motion abnormalities. There is mild left ventricular hypertrophy. Left ventricular diastolic parameters were normal.  3. Right ventricular systolic function is normal. The right ventricular size is normal.  4. The mitral valve is normal in structure. No evidence of mitral valve regurgitation. No evidence of mitral stenosis.  5. The aortic valve is tricuspid. Aortic valve regurgitation is not visualized. No aortic stenosis is present.  6. The inferior vena cava is dilated in size with <50% respiratory variability, suggesting right atrial pressure of 15 mmHg. FINDINGS  Left Ventricle: Left ventricular ejection fraction,  by estimation, is >75%. The left ventricle has hyperdynamic function. The left ventricle has no regional wall motion abnormalities. The left ventricular internal cavity size was normal in size. There is mild left ventricular hypertrophy. Left ventricular diastolic parameters were normal. Right Ventricle: The right ventricular size is normal. Right ventricular systolic function is normal. Left Atrium: Left atrial size was normal in size. Right Atrium: Right atrial size was normal in size. Pericardium: There is no evidence of pericardial effusion. Mitral Valve: The mitral valve is normal in structure. No evidence of mitral valve regurgitation. No evidence of mitral valve stenosis. Tricuspid Valve: The tricuspid valve is normal in structure. Tricuspid valve regurgitation is trivial. No evidence of tricuspid stenosis. Aortic Valve: The aortic valve is tricuspid. Aortic valve regurgitation is not visualized. No aortic stenosis is present. Aortic valve mean gradient measures 11.0 mmHg. Aortic valve peak gradient measures 20.2 mmHg. Aortic valve area, by VTI measures 1.91 cm. Pulmonic Valve: The pulmonic valve was normal in structure.  Pulmonic valve regurgitation is not visualized. No evidence of pulmonic stenosis. Aorta: The aortic root is normal in size and structure. Ascending aorta measurements are within normal limits for age when indexed to body surface area. Venous: IVC assessment for right atrial pressure unable to be performed due to mechanical ventilation. The inferior vena cava is dilated in size with less than 50% respiratory variability, suggesting right atrial pressure of 15 mmHg. IAS/Shunts: No atrial level shunt detected by color flow Doppler. Additional Comments: Elevated LVOT gradient (2.3 m/s) likely due to hyperdynamic LV function.  LEFT VENTRICLE PLAX 2D LVIDd:         5.10 cm   Diastology LVIDs:         3.00 cm   LV e' medial:    11.60 cm/s LV PW:         1.20 cm   LV E/e' medial:  11.7 LV IVS:        1.30 cm   LV e' lateral:   11.60 cm/s LVOT diam:     1.90 cm   LV E/e' lateral: 11.7 LV SV:         83 LV SV Index:   36 LVOT Area:     2.84 cm  RIGHT VENTRICLE             IVC RV S prime:     19.80 cm/s  IVC diam: 2.40 cm TAPSE (M-mode): 2.6 cm LEFT ATRIUM             Index        RIGHT ATRIUM           Index LA diam:        3.70 cm 1.60 cm/m   RA Area:     11.60 cm LA Vol (A2C):   72.8 ml 31.55 ml/m  RA Volume:   25.30 ml  10.97 ml/m LA Vol (A4C):   53.4 ml 23.15 ml/m LA Biplane Vol: 66.0 ml 28.61 ml/m  AORTIC VALVE AV Area (Vmax):    2.29 cm AV Area (Vmean):   2.25 cm AV Area (VTI):     1.91 cm AV Vmax:           225.00 cm/s AV Vmean:          149.000 cm/s AV VTI:            0.436 m AV Peak Grad:      20.2 mmHg AV Mean Grad:      11.0 mmHg  LVOT Vmax:         182.00 cm/s LVOT Vmean:        118.000 cm/s LVOT VTI:          0.294 m LVOT/AV VTI ratio: 0.67  AORTA Ao Root diam: 2.80 cm Ao Asc diam:  3.00 cm MITRAL VALVE MV Area (PHT): 2.82 cm     SHUNTS MV Decel Time: 269 msec     Systemic VTI:  0.29 m MV E velocity: 136.00 cm/s  Systemic Diam: 1.90 cm MV A velocity: 60.20 cm/s MV E/A ratio:  2.26 Olga Millers MD  Electronically signed by Olga Millers MD Signature Date/Time: 03/22/2022/12:01:14 PM    Final    VAS [place] TRANSCRANIAL DOPPLER  Result Date: 03/22/2022  Transcranial Doppler Patient Name:  Michaelle Copas  Date of Exam:   03/21/2022 Medical Rec #: [id number]        Accession #:    [id number] Date of Birth: 01-08-1981        Patient Gender: F Patient Age:   35 years Exam Location:  [facility] Procedure:      VAS [place] TRANSCRANIAL DOPPLER Referring Phys: Ronaldo Miyamoto CABBELL --------------------------------------------------------------------------------  Indications: Subarachnoid hemorrhage. History: S/P Left side craniotomy, aneurysm clipping, left side hematoma evacuation (03/15/2022). Limitations: Bandaging, staples, movement Comparison Study: 03/20/22 TCD Performing Technologist: Gertie Fey MHA, RDMS, RVT, RDCS  Examination Guidelines: A complete evaluation includes B-mode imaging, spectral Doppler, color Doppler, and power Doppler as needed of all accessible portions of each vessel. Bilateral testing is considered an integral part of a complete examination. Limited examinations for reoccurring indications may be performed as noted.  +----------+-------------+----------+-----------+------------------+ RIGHT TCD Right VM (cm)Depth (cm)Pulsatility     Comment       +----------+-------------+----------+-----------+------------------+ MCA           71.00                 1.28                       +----------+-------------+----------+-----------+------------------+ ACA          -24.00                 1.32                       +----------+-------------+----------+-----------+------------------+ Term ICA      62.00                 1.23                       +----------+-------------+----------+-----------+------------------+ PCA           51.00                 0.95                       +----------+-------------+----------+-----------+------------------+ Opthalmic     9.00                   1.08                       +----------+-------------+----------+-----------+------------------+ ICA siphon                                  Unable to insonate +----------+-------------+----------+-----------+------------------+ Vertebral  Unable to insonate +----------+-------------+----------+-----------+------------------+ Distal ICA    28.00                                            +----------+-------------+----------+-----------+------------------+  +----------+------------+----------+-----------+------------------+ LEFT TCD  Left VM (cm)Depth (cm)Pulsatility     Comment       +----------+------------+----------+-----------+------------------+ MCA                                        Unable to insonate +----------+------------+----------+-----------+------------------+ ACA                                        Unable to insonate +----------+------------+----------+-----------+------------------+ Term ICA                                   Unable to insonate +----------+------------+----------+-----------+------------------+ PCA                                        Unable to insonate +----------+------------+----------+-----------+------------------+ Opthalmic    21.00                 1.44                       +----------+------------+----------+-----------+------------------+ ICA siphon   31.00                 1.52                       +----------+------------+----------+-----------+------------------+ Vertebral                                  Unable to insonate +----------+------------+----------+-----------+------------------+  +------------+-----+------------------+             VM cm     Comment       +------------+-----+------------------+ Prox Basilar     Unable to insonate +------------+-----+------------------+ +----------------------+----+ Right Lindegaard Ratio2.53  +----------------------+----+  Summary:  Absent left temporal and suboccipital windows limit evaluation.Normal mean flow velocities in remaining identified vessels of anterior and posterior cerebral circulations. *See table(s) above for TCD measurements and observations.  Diagnosing physician: Delia Heady MD Electronically signed by Delia Heady MD on 03/22/2022 at 11:42:54 AM.    Final    DG Chest Port 1 View  Result Date: 03/22/2022 CLINICAL DATA:  Chest pain EXAM: PORTABLE CHEST 1 VIEW COMPARISON:  Radiograph 03/21/2022 FINDINGS: Endotracheal tube has been slightly retracted, tip overlies the distal trachea proximally 1.7 cm above the carina. Feeding tube passes below the diaphragm, tip excluded by collimation. Unchanged enlarged cardiac silhouette. There are layering pleural effusions and diffuse interstitial opacities. No pneumothorax. No acute osseous abnormality. IMPRESSION: Endotracheal tube has been slightly retracted, tip overlies the distal trachea approximately 1.7 cm above the carina. Unchanged cardiomegaly, small layering pleural effusions, and mild pulmonary edema. Electronically Signed   By: Caprice Renshaw M.D.   On: 03/22/2022 08:22   VAS [place] TRANSCRANIAL DOPPLER  Result Date: 03/21/2022  Transcranial Doppler  Patient Name:  ZAYLEY ARRAS  Date of Exam:   03/20/2022 Medical Rec #: [id number]        Accession #:    [id number] Date of Birth: [birth year]        Patient Gender: F Patient Age:   71 years Exam Location:  [facility] Procedure:      VAS [place] TRANSCRANIAL DOPPLER Referring Phys: Ronaldo Miyamoto CABBELL --------------------------------------------------------------------------------  Indications: Subarachnoid hemorrhage. History: SAH S/P Left side craniotomy, aneurysm clipping, left side hematoma evacuation (03/12/2022). Limitations: VERY difficult study due to patient's altered mental status              (constant movement and moaning) Comparison Study: No pervious exams Performing Technologist:  Jody Hill RVT, RDMS  Examination Guidelines: A complete evaluation includes B-mode imaging, spectral Doppler, color Doppler, and power Doppler as needed of all accessible portions of each vessel. Bilateral testing is considered an integral part of a complete examination. Limited examinations for reoccurring indications may be performed as noted.  +----------+-------------+----------+-----------+-------------+ RIGHT TCD Right VM (cm)Depth (cm)Pulsatility   Comment    +----------+-------------+----------+-----------+-------------+ MCA           64.00                  1.2                  +----------+-------------+----------+-----------+-------------+ ACA                                         not insonated +----------+-------------+----------+-----------+-------------+ Term ICA      45.00                 1.25                  +----------+-------------+----------+-----------+-------------+ PCA           33.00                 1.36                  +----------+-------------+----------+-----------+-------------+ Opthalmic     21.00                 2.07                  +----------+-------------+----------+-----------+-------------+ ICA siphon    20.00                 2.37                  +----------+-------------+----------+-----------+-------------+ Vertebral    -28.00                 1.70                  +----------+-------------+----------+-----------+-------------+ Distal ICA    20.00                 1.62                  +----------+-------------+----------+-----------+-------------+  +----------+------------+----------+-----------+-------------+ LEFT TCD  Left VM (cm)Depth (cm)Pulsatility   Comment    +----------+------------+----------+-----------+-------------+ MCA          45.00                 1.62                  +----------+------------+----------+-----------+-------------+ ACA  not insonated  +----------+------------+----------+-----------+-------------+ Term ICA     37.00                 1.56                  +----------+------------+----------+-----------+-------------+ PCA          31.00                 1.53                  +----------+------------+----------+-----------+-------------+ Opthalmic    16.00                 1.43                  +----------+------------+----------+-----------+-------------+ ICA siphon                                 not insonated +----------+------------+----------+-----------+-------------+ Vertebral    -27.00                1.13                  +----------+------------+----------+-----------+-------------+ Distal ICA   22.00                 1.38                  +----------+------------+----------+-----------+-------------+  +------------+------+-------+             VM cm Comment +------------+------+-------+ Prox Basilar-24.00        +------------+------+-------+ Dist Basilar-22.00        +------------+------+-------+ +----------------------+---+ Right Lindegaard Ratio3.2 +----------------------+---+ +---------------------+----+ Left Lindegaard Ratio2.05 +---------------------+----+  Summary: This was a normal transcranial Doppler study, with normal flow direction and velocity of all identified vessels of the anterior and posterior circulations, with no evidence of stenosis, vasospasm or occlusion. There was no evidence of intracranial disease. Globally increased pulsatility indices suggest diffuse increase in intracranial pressure or generalized intracranial atherosclerosis. *See table(s) above for TCD measurements and observations.  Diagnosing physician: Delia Heady MD Electronically signed by Delia Heady MD on 03/21/2022 at 12:44:22 PM.    Final    DG Chest Port 1 View  Result Date: 03/21/2022 CLINICAL DATA:  Endotracheal tube placement EXAM: PORTABLE CHEST 1 VIEW COMPARISON:  03/21/2022 FINDINGS: Interval  placement of endotracheal tube tube, tip positioned right at the level of the carina. Interval placement of enteric feeding tube, partially imaged, below the diaphragm. Unchanged cardiomegaly. Small, layering bilateral pleural effusions and diffuse interstitial opacity, unchanged. IMPRESSION: 1. Interval placement of endotracheal tube, tip positioned right at the level of the carina. Consider slight retraction to the midtrachea. 2. Interval placement of enteric feeding tube, partially imaged, below the diaphragm. 3. Unchanged cardiomegaly, small, layering bilateral pleural effusions and diffuse interstitial opacity. Electronically Signed   By: Jearld Lesch M.D.   On: 03/21/2022 12:03   DG Abd Portable 1V  Result Date: 03/21/2022 CLINICAL DATA:  Feeding tube placement EXAM: PORTABLE ABDOMEN - 1 VIEW COMPARISON:  Portable exam 1043 hours compared to 03/12/2022 FINDINGS: Tip of feeding tube projects over gastric antrum. Nonobstructive bowel gas pattern. IMPRESSION: Tip of feeding tube projects over gastric antrum. Electronically Signed   By: Ulyses Southward M.D.   On: 03/21/2022 11:29   DG Chest Port 1 View  Result Date: 03/21/2022 CLINICAL DATA:  Hypoxia. EXAM: PORTABLE CHEST 1 VIEW COMPARISON:  Chest radiograph 03/16/2022 FINDINGS: Right jugular  catheter has been removed. Enteric tube remains in place, coursing into the abdomen with tip not imaged. The cardiac silhouette remains enlarged. There are increasing airspace opacities in the perihilar regions and lung bases bilaterally. There is likely a small left pleural effusion. No pneumothorax is identified. IMPRESSION: Increasing bilateral airspace opacities which may reflect edema with a small left pleural effusion. Electronically Signed   By: Sebastian Ache M.D.   On: 03/21/2022 08:39   EEG adult  Result Date: 03/20/2022 Charlsie Quest, MD     03/20/2022  5:14 PM Patient Name: HADEN CAVENAUGH MRN: [id number] Epilepsy Attending: Charlsie Quest Referring  Physician/Provider: Tim Lair, PA-C Date:  03/20/2022 Duration: 22.03 mins Patient history: 41 y.o. female SAHd# 2 POD#1 s/p clipping LMCA aneurysm. EEG to evaluate for seizure. Level of alertness: lethargic AEDs during EEG study: None Technical aspects: This EEG study was done with scalp electrodes positioned according to the 10-20 International system of electrode placement. Electrical activity was acquired at a sampling rate of  and reviewed with a high frequency filter of  and a low frequency filter of . EEG data were recorded continuously and digitally stored. Description: EEG showed continuous generalized and maximal left posterior quadrant 3 to 7 Hz theta-delta slowing. Hyperventilation and photic stimulation were not performed.   ABNORMALITY - Continuous slow, generalized and maximal left posterior quadrant IMPRESSION: This study is suggestive of cortical dysfunction arising from left posterior quadrant likely secondary to underlying structural abnormality. There is also moderate to severe diffuse encephalopathy, nonspecific etiology. No seizures or epileptiform discharges were seen throughout the recording. If suspicion for interictal activity remains a concern, a prolonged study can be considered. Charlsie Quest   DG CHEST PORT 1 VIEW  Result Date: 03/20/2022 CLINICAL DATA:  Shortness of breath. EXAM: PORTABLE CHEST 1 VIEW COMPARISON:  March 19, 2022 FINDINGS: The endotracheal tube seen on the prior study has been removed. Interval right internal jugular venous catheter placement is noted with its distal tip seen at the junction of the superior vena cava and right atrium. Stable nasogastric tube positioning is seen. The cardiac silhouette is enlarged and unchanged in size. Mild, stable atelectasis is seen within the right lung base. There is no evidence of a pleural effusion or pneumothorax. The visualized skeletal structures are unremarkable. IMPRESSION: Interval right internal  jugular venous catheter placement positioning, as described above, without evidence of pneumothorax. Electronically Signed   By: Aram Candela M.D.   On: 03/20/2022 00:06   DG Abd Portable 1V  Result Date: 03/07/2022 CLINICAL DATA:  Check gastric catheter placement EXAM: PORTABLE ABDOMEN - 1 VIEW COMPARISON:  None Available. FINDINGS: Gastric catheter is noted within the stomach in satisfactory position. Scattered large and small bowel gas is noted. No free air is noted. IMPRESSION: Gastric catheter within the stomach. Electronically Signed   By: Alcide Clever M.D.   On: 03/27/2022 19:40   DG Chest Port 1 View  Result Date: 03/29/2022 CLINICAL DATA:  Aspiration. EXAM: PORTABLE CHEST 1 VIEW COMPARISON:  March 18, 2022. FINDINGS: Stable cardiomediastinal silhouette. Endotracheal and nasogastric tubes are in good position. Mild right basilar atelectasis or infiltrate is noted. Minimal left basilar subsegmental atelectasis is noted. Bony thorax is unremarkable. IMPRESSION: Stable support apparatus. Mild right basilar atelectasis or infiltrate. Electronically Signed   By: Lupita Raider M.D.   On: 03/14/2022 08:07   CT HEAD WO CONTRAST ( )  Result Date: 03/22/2022 CLINICAL DATA:  Known subarachnoid hemorrhage, now with blown  left pupil EXAM: CT HEAD WITHOUT CONTRAST TECHNIQUE: Contiguous axial images were obtained from the base of the skull through the vertex without intravenous contrast. RADIATION DOSE REDUCTION: This exam was performed according to the departmental dose-optimization program which includes automated exposure control, adjustment of the mA and/or kV according to patient size and/or use of iterative reconstruction technique. COMPARISON:  03/22/2022 4:55 p.m. FINDINGS: Brain: Redemonstrated hyperdense hemorrhage in the anterior left frontal lobe, which measures 4.8 x 1.9 x 2.8 cm (AP x TR x CC) (series 3, image 14 and series 5, image 23), previously 4.8 x 1.9 x 2.8 cm when remeasured  similarly. Possible adjacent trace subarachnoid hemorrhage. Unchanged surrounding edema, with mass effect on the left lateral ventricle and 3 mm of left-to-right midline shift. No hydrocephalus. No acute cortical infarct or mass. Vascular: No hyperdense vessel. Skull: No acute osseous abnormality. Sinuses/Orbits: Small mucous retention cyst in the left maxillary sinus. Mild mucosal thickening in the anterior ethmoid air cells. The orbits are unremarkable. Other: The mastoids are well aerated. IMPRESSION: Unchanged acute intraparenchymal hemorrhage in the anterior left frontal lobe, with surrounding edema causing mass effect on the left lateral ventricle and unchanged 3 mm of left-to-right midline shift. Electronically Signed   By: Wiliam Ke M.D.   On: 03/08/2022 22:45   CT ANGIO HEAD NECK W WO CM  Result Date: 03/06/2022 CLINICAL DATA:  Intracranial hemorrhage. EXAM: CT ANGIOGRAPHY HEAD AND NECK TECHNIQUE: Multidetector CT imaging of the head and neck was performed using the standard protocol during bolus administration of intravenous contrast. Multiplanar CT image reconstructions and MIPs were obtained to evaluate the vascular anatomy. Carotid stenosis measurements (when applicable) are obtained utilizing NASCET criteria, using the distal internal carotid diameter as the denominator. RADIATION DOSE REDUCTION: This exam was performed according to the departmental dose-optimization program which includes automated exposure control, adjustment of the mA and/or kV according to patient size and/or use of iterative reconstruction technique. CONTRAST:  75mL OMNIPAQUE IOHEXOL 350 MG/ML SOLN COMPARISON:  CT head without contrast 03/17/2022 FINDINGS: CTA NECK FINDINGS Aortic arch: Common origin of the left common carotid artery and innominate artery noted. Significant atherosclerotic calcification, stenosis or aneurysm is present. Right carotid system: The right common carotid artery is within normal limits.  Bifurcation is unremarkable. The cervical right ICA is normal. Left carotid system: The left common carotid artery is within normal limits. Bifurcation is unremarkable. Cervical left ICA is normal. Vertebral arteries: The vertebral arteries are codominant. Both vertebral arteries originate from the subclavian arteries without significant stenosis. No significant stenosis is present in either vertebral artery in the neck. Skeleton: Vertebral body heights and alignment are normal. No focal osseous lesions are present. Other neck: Patient is intubated. Endotracheal tube terminates above the carina. NG tube is in place. Upper chest: Patchy airspace opacity is present in the posterior right upper lobe. Thoracic inlet is within normal limits. Review of the MIP images confirms the above findings CTA HEAD FINDINGS Anterior circulation: The internal carotid arteries are within normal limits from the skull base to the ICA termini. The A1 and M1 segments are normal. A 4 mm aneurysm extends superiorly from the left MCA bifurcation. This is situated just below the hemorrhage. MCA branch vessels are within normal limits bilaterally. The ACA branch vessels are unremarkable Posterior circulation: Vertebral arteries are codominant. PICA origins are visualized and normal. Vertebrobasilar junction and basilar artery are normal. Both posterior cerebral arteries originate from basilar tip. The PCA branch vessels are within normal limits. Venous sinuses:  The dural sinuses are patent. The straight sinus deep cerebral veins are intact. Cortical veins are within normal limits. No significant vascular malformation is evident. Anatomic variants: None Review of the MIP images confirms the above findings IMPRESSION: 1. 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 2. No other significant proximal stenosis, aneurysm, or branch vessel occlusion within the Circle of Willis. 3. Normal CTA of the neck. 4. Patchy airspace opacity in the posterior right  upper lobe may represent atelectasis or pneumonia. These results were called by telephone at the time of interpretation on 04-06-22 at 6:39 pm to provider Dr. Barbaraann Barthel, who verbally acknowledged these results. Electronically Signed   By: Marin Roberts M.D.   On: 04/06/22 18:40   CT HEAD WO CONTRAST ( )  Addendum Date: 2022/04/06   ADDENDUM REPORT: 04-06-22 18:10 ADDENDUM: Hyperintensity in the subarachnoid spaces was noted the time of interpretation. At there is felt to represent pseudo subarachnoid hemorrhage. The case was discussed with Dr. Mikal Plane. Hyperintensity is present in the interpeduncular notch in subarachnoid hemorrhage is not excluded. CTA head and neck and MR head will be ordered. Electronically Signed   By: Marin Roberts M.D.   On: Apr 06, 2022 18:10   Result Date: 04-06-2022 CLINICAL DATA:  Mental status change. Patient found unresponsive. Hypertension. EXAM: CT HEAD WITHOUT CONTRAST TECHNIQUE: Contiguous axial images were obtained from the base of the skull through the vertex without intravenous contrast. RADIATION DOSE REDUCTION: This exam was performed according to the departmental dose-optimization program which includes automated exposure control, adjustment of the mA and/or kV according to patient size and/or use of iterative reconstruction technique. COMPARISON:  CT head without contrast 10/07/2017 FINDINGS: Brain: Focal parenchymal hemorrhage is present in the anterior inferior left frontal lobe measuring 4.1 x 2.1 x 2.5 cm. Surrounding edema is present. This results in mass effect on the left lateral ventricle with mild subfalcine herniation. No hydrocephalus. Ventricles are somewhat compressed. No other focal lesions are present. Vascular: No hyperdense vessel or unexpected calcification. Skull: Calvarium is intact. No focal lytic or blastic lesions are present. No significant extracranial soft tissue lesion is present. Sinuses/Orbits: The paranasal sinuses and  mastoid air cells are clear. The globes and orbits are within normal limits. IMPRESSION: 1. Focal parenchymal hemorrhage in the anterior inferior left frontal lobe measuring 4.1 x 2.1 x 2.5 cm. Surrounding edema is present. 2. This results in mass effect on the left lateral ventricle with mild subfalcine herniation. 3. No hydrocephalus. Critical Value/emergent results were called by telephone at the time of interpretation on 06-Apr-2022 at 5:15 pm to provider [facility] , who verbally acknowledged these results. Electronically Signed: By: Marin Roberts M.D. On: 04-06-22 17:16   DG Chest Port 1 View  Result Date: 04/06/22 CLINICAL DATA:  Respiratory failure EXAM: PORTABLE CHEST 1 VIEW COMPARISON:  01/08/2018. FINDINGS: Endotracheal tube is seen 3.5 cm above the carina. Nasogastric tube tip is seen within the gastric fundus. Lung volumes are small and there is right basilar atelectasis. No confluent pulmonary infiltrate. No pneumothorax or pleural effusion. Cardiac size within normal limits. No acute bone abnormality IMPRESSION: 1. Support tubes in appropriate position. 2. Pulmonary hypoinflation. Electronically Signed   By: Helyn Numbers M.D.   On: 04/06/2022 17:43    Microbiology No results found for this or any previous visit (from the past 240 hour(s)).  Lab Basic Metabolic Panel: No results for input(s): "NA", "K", "CL", "CO2", "GLUCOSE", "BUN", "CREATININE", "CALCIUM", "MG", "PHOS" in the last 168 hours. Liver Function  Tests: No results for input(s): "AST", "ALT", "ALKPHOS", "BILITOT", "PROT", "ALBUMIN" in the last 168 hours. No results for input(s): "LIPASE", "AMYLASE" in the last 168 hours. No results for input(s): "AMMONIA" in the last 168 hours. CBC: No results for input(s): "WBC", "NEUTROABS", "HGB", "HCT", "MCV", "PLT" in the last 168 hours. Cardiac Enzymes: No results for input(s): "CKTOTAL", "CKMB", "CKMBINDEX", "TROPONINI" in the last 168 hours. Sepsis Labs: No  results for input(s): "PROCALCITON", "WBC", "LATICACIDVEN" in the last 168 hours.   Lorin Glass 04/09/2022, 2:24 PM

## 2022-05-01 NOTE — Procedures (Signed)
   Operative Note  Date: 01-May-2022  Procedure: open tracheostomy  Pre-op diagnosis: malpositioned tracheostomy, failed oral intubation, respiratory arrest Post-op diagnosis: same  Indication and clinical history: The patient is a 41 y.o. year old female with malpositioned tracheostomy and failed oral intubation  Surgeon: Jesusita Oka, MD  Findings:  Specimen: none EBL: <5cc  Drains/Implants: #8 Shiley cuffed proximal XLT tracheostomy tube  Description of procedure: This procedure was performed emergently and therefore informed consent was not obtained. A bronchoscope was placed into the airway via the existing tracheostomy and the proximal end of the bronchoscope cut to act as a bougie. The existing trach was removed and an #8pXLT trach was passed over the bronchoscope, but met resistance. I then did a cutdown over the trachea and digitally confirmed the airway and passed the #8pXLT into the airway. Positive colorimetry was obtained and bronchoscopic confirmation of airway placement was completed. ACLS continued, however ROSC was unable to be obtained and the patient expired.     Reather Laurence, MD General and [place] [facility]

## 2022-05-01 NOTE — Progress Notes (Signed)
04/24/2022  Called to room for volume loss on tracheostomy tube.  Used bronchoscope and false tract noted at distal end.  Able to pass bronchoscope into airway but unable to advance XLT over bronchoscope so left 8-0 in place.  Called Dr. Tonia Brooms to bedside to assist.  Janina Mayo revision planned so RSI meds given.  Shortly after RSI meds given her oxygenation deteriorated and subcutaneous air increased.  She went into PEA arrest.  Attempted glidescope intubation but she had marked angioedema, swollen cords and secretions with inability to pass ETT.  Dr. [name] arrived to bedside to assist.  Used bronchoscope through 8-0 tube and visualized airway.  Cut bronchoscope to serve as guide for Dr. [name] who cut down and placed new XLT trach.  Visualized within the airway with bronchoscope.  Unfortunately due to the prolonged hypoxemia patient's pulse was unable to be recovered.  I have called and updated family.  Appreciate Dr. Marvetta Gibbons help as well as Rns/Rts with this tough situation.  Will update primary.  Total 85 min cc time Jesusita Oka

## 2022-05-01 NOTE — Progress Notes (Signed)
Patient has audible cuff leak but pilot balloon is inflated. VTE 80-147ml and MVe 3.7. RT obtained ABG per CCM. Results unremarkable. Per CCM, RT removed #8 shiley and replaced with another trach of the same size. Good color change noted on CO2 detector but cuff leak and VT issues unresolved. RT then tried to place #8 XLT trach but unable to insert all the way. RT placed #8 Shiley back in and placed patient back on the ventilator. CCM available on room monitor for entire procedure.

## 2022-05-01 NOTE — Progress Notes (Signed)
Notified PCCM that pt's trach was losing exhaled volume through ventilator but at the moment pt was stable with SAT's of 100% but needed this addressed before starting rounds. Prepared for a trach change from 8.0 to 8.0 XLTP, pt placed on 100% FIO2. After first attempt was made, PCCM was unable to get past false trach with 8.0 XLTP but was able to keep SATs up with the 8.0 placed back in. A second RRT was called to assist and to bring bronchoscope. A second PCCM was called to bedside to assist. Glide brought to bedside for intubation and RSI medications per MD. During procedure, pt's SATs dropped and pulses were lost. CPR initiated while 2 RRTs, 2 PCCMs and Trauma MD continued to try and secure airway. Once airway was secured via trach, pulses were unable to be obtained and TOD was called.  RRT equipment used during: 8.0 ETT with rigid stylet 6.5 ETT with rigid stylet SLIM bronchoscope REGULAR bronchoscope S4 Glide x2 Ambubag Omniflex x2 Bronch adapter x2 Capnography via AED Bougie PEEP valve Trauma shears 8.0 XLTP x2  Condolences for the family and staff.

## 2022-05-01 NOTE — Progress Notes (Signed)
   04/03/2022 0924  Clinical Encounter Type  Visited With Health care provider;Patient not available  Visit Type Death  Referral From Nurse  Consult/Referral To Chaplain Benetta Spar)   Paged to meet with the family of [name]. Upon arrival to the room patient's family had already left. [address] [place], Rhonda Delgado., [phone]

## 2022-05-01 NOTE — Consult Note (Signed)
Reason for Consult/Chief Complaint: tracheostomy with false passage Consultant: Tonia Brooms, MD  Rhonda Delgado is an 41 y.o. female.   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 Trach placed 6/26 by Dr. Denese Killings. S/p trach exchange overnight due to inadequate volumes with continued difficulties with ventilation. On my arrival, patient with significantly oropharyngeal edema, CCM physicians x2 having difficulty with oral intubation due to this and O2 sats 40s. Patient subsequently arrested and surgical airway performed.   Past Medical History:  Diagnosis Date   Asthma    COPD (chronic obstructive pulmonary disease) (HCC)    Hypertension     Past Surgical History:  Procedure Laterality Date   CRANIOTOMY Left 03/09/2022   Procedure: LEFT FRONTOTEMPORAL CRANIOTOMY FOR ANEURYSM CLIPPING WITH EVACUATION OF HEMATOMA;  Surgeon: Coletta Memos, MD;  Location: [facility] OR;  Service: Neurosurgery;  Laterality: Left;   denies      Family History  Problem Relation Age of Onset   Heart disease Mother    Hypertension Mother    Alcohol abuse Father    Cancer Father    Alcohol abuse Paternal Grandmother    Asthma Sister    Breast cancer Paternal Aunt     Social History:  reports that she has been smoking cigarettes and cigars. She has a 20.00 pack-year smoking history. She has never used smokeless tobacco. She reports current alcohol use of about 2.0 standard drinks of alcohol per week. She reports that she does not currently use drugs after having used the following drugs: Marijuana.  Allergies:  Allergies  Allergen Reactions   Eggs Or Egg-Derived Products Other (See Comments)    Abdominal cramping     Medications: I have reviewed the patient's current medications.  Results for orders placed or performed during the hospital encounter of 03/02/2022 (from the past 48 hour(s))  CBC     Status: Abnormal    Collection Time: 03/31/22  3:11 AM  Result Value Ref Range   WBC 17.8 (H) 4.0 - 10.5 K/uL   RBC 3.20 (L) 3.87 - 5.11 MIL/uL   Hemoglobin 7.7 (L) 12.0 - 15.0 g/dL   HCT 57.3 (L) 22.0 - 25.4 %   MCV 82.5 80.0 - 100.0 fL   MCH 24.1 (L) 26.0 - 34.0 pg   MCHC 29.2 (L) 30.0 - 36.0 g/dL   RDW 27.0 (H) 62.3 - 76.2 %   Platelets 259 150 - 400 K/uL   nRBC 0.7 (H) 0.0 - 0.2 %    Comment: Performed at [facility], [address], [place], [place] [address]  Basic metabolic panel     Status: Abnormal   Collection Time: 03/31/22  3:11 AM  Result Value Ref Range   Sodium 142 135 - 145 mmol/L   Potassium 3.8 3.5 - 5.1 mmol/L   Chloride 115 (H) 98 - 111 mmol/L   CO2 24 22 - 32 mmol/L   Glucose, Bld 158 (H) 70 - 99 mg/dL    Comment: Glucose reference range applies only to samples taken after fasting for at least 8 hours.   BUN 23 (H) 6 - 20 mg/dL   Creatinine, Ser 7.61 0.44 - 1.00 mg/dL   Calcium 8.8 (L) 8.9 - 10.3 mg/dL   GFR, Estimated >60 >73 mL/min    Comment: (NOTE) Calculated using the CKD-EPI Creatinine Equation (2021)    Anion gap 3 (L) 5 - 15    Comment:  Performed at [facility], [address] [place], [place] [address]  Magnesium     Status: None   Collection Time: 03/31/22  3:11 AM  Result Value Ref Range   Magnesium 2.3 1.7 - 2.4 mg/dL    Comment: Performed at [facility], [address], [place], [place] [address]  Phosphorus     Status: None   Collection Time: 03/31/22  3:11 AM  Result Value Ref Range   Phosphorus 3.4 2.5 - 4.6 mg/dL    Comment: Performed at [facility], [address], [place], [place] [address]  Glucose, capillary     Status: Abnormal   Collection Time: 03/31/22  3:16 AM  Result Value Ref Range   Glucose-Capillary 154 (H) 70 - 99 mg/dL    Comment: Glucose reference range applies only to samples taken after fasting for at least 8 hours.  I-STAT 7, (LYTES, BLD GAS, ICA, H+H)     Status: Abnormal   Collection Time: 04/04/2022   4:58 AM  Result Value Ref Range   pH, Arterial 7.408 7.35 - 7.45   pCO2 arterial 51.7 (H) 32 - 48 mmHg   pO2, Arterial 356 (H) 83 - 108 mmHg   Bicarbonate 32.5 (H) 20.0 - 28.0 mmol/L   TCO2 34 (H) 22 - 32 mmol/L   O2 Saturation 100 %   Acid-Base Excess 7.0 (H) 0.0 - 2.0 mmol/L   Sodium 141 135 - 145 mmol/L   Potassium 3.3 (L) 3.5 - 5.1 mmol/L   Calcium, Ion 1.36 1.15 - 1.40 mmol/L   HCT 25.0 (L) 36.0 - 46.0 %   Hemoglobin 8.5 (L) 12.0 - 15.0 g/dL   Patient temperature 96.7 F    Collection site RADIAL, Trego'S TEST ACCEPTABLE    Drawn by RT    Sample type ARTERIAL   CBC     Status: Abnormal   Collection Time: 04/27/2022  6:04 AM  Result Value Ref Range   WBC 15.7 (H) 4.0 - 10.5 K/uL   RBC 3.30 (L) 3.87 - 5.11 MIL/uL   Hemoglobin 7.9 (L) 12.0 - 15.0 g/dL   HCT 89.3 (L) 81.0 - 17.5 %   MCV 82.4 80.0 - 100.0 fL   MCH 23.9 (L) 26.0 - 34.0 pg   MCHC 29.0 (L) 30.0 - 36.0 g/dL   RDW 10.2 (H) 58.5 - 27.7 %   Platelets 264 150 - 400 K/uL   nRBC 0.1 0.0 - 0.2 %    Comment: Performed at [facility], [address] [place] [address], [place], [place] [address]  Basic metabolic panel     Status: Abnormal   Collection Time: 04/19/2022  6:04 AM  Result Value Ref Range   Sodium 139 135 - 145 mmol/L   Potassium 3.4 (L) 3.5 - 5.1 mmol/L   Chloride 103 98 - 111 mmol/L   CO2 26 22 - 32 mmol/L   Glucose, Bld 177 (H) 70 - 99 mg/dL    Comment: Glucose reference range applies only to samples taken after fasting for at least 8 hours.   BUN 22 (H) 6 - 20 mg/dL   Creatinine, Ser 5.36 0.44 - 1.00 mg/dL   Calcium 14.4 8.9 - 31.5 mg/dL   GFR, Estimated >40 >08 mL/min    Comment: (NOTE) Calculated using the CKD-EPI Creatinine Equation (2021)    Anion gap 10 5 - 15    Comment: Performed at [facility], [address] [place], [place] [address]  Magnesium  Status: None   Collection Time: 04/21/2022  6:04 AM  Result Value Ref Range   Magnesium 2.4 1.7 - 2.4 mg/dL    Comment: Performed at [facility], [address], [place], [place] [address]  Phosphorus     Status: Abnormal   Collection Time: 04/12/2022  6:04 AM  Result Value Ref Range   Phosphorus 4.7 (H) 2.5 - 4.6 mg/dL    Comment: Performed at [facility], [address] [place], [place] [address]  Triglycerides     Status: None   Collection Time: 04/24/2022  6:04 AM  Result Value Ref Range   Triglycerides 138 <150 mg/dL    Comment: Performed at [facility], [address], [place], [place] [address]    VAS [place] TRANSCRANIAL DOPPLER  Result Date: 03/30/2022  Transcranial Doppler Patient Name:  Rhonda Delgado  Date of Exam:   03/30/2022 Medical Rec #: [id number]        Accession #:    [id number] Date of Birth: Apr 01, 1981        Patient Gender: F Patient Age:   89 years Exam Location:  [facility] Procedure:      VAS [place] TRANSCRANIAL DOPPLER Referring Phys: Ronaldo Miyamoto CABBELL --------------------------------------------------------------------------------  Indications: Subarachnoid hemorrhage. History: S/P Left side craniotomy, aneurysm clipping, left side hematoma evacuation (03/17/2022). Comparison Study: 03-28-2022 Most recent prior study. Performing Technologist: Jean Rosenthal RDMS, RVT  Examination Guidelines: A complete evaluation includes B-mode imaging, spectral Doppler, color Doppler, and power Doppler as needed of all accessible portions of each vessel. Bilateral testing is considered an integral part of a complete examination. Limited examinations for reoccurring indications may be performed as noted.  +----------+-------------+----------+-----------+-------+ RIGHT TCD Right VM (cm)Depth (cm)PulsatilityComment +----------+-------------+----------+-----------+-------+ MCA           82.00       5.60      1.23            +----------+-------------+----------+-----------+-------+ ACA          -28.00                 1.35            +----------+-------------+----------+-----------+-------+ Term ICA      59.00                  1.30            +----------+-------------+----------+-----------+-------+ PCA           32.00                 1.18            +----------+-------------+----------+-----------+-------+ Opthalmic     11.00                 1.27            +----------+-------------+----------+-----------+-------+ ICA siphon    36.00                 1.10            +----------+-------------+----------+-----------+-------+ Vertebral    -17.00                 1.01            +----------+-------------+----------+-----------+-------+ Distal ICA    22.00                 1.29            +----------+-------------+----------+-----------+-------+  +----------+------------+----------+-----------+-------+ LEFT TCD  Left VM (cm)Depth (cm)PulsatilityComment +----------+------------+----------+-----------+-------+ MCA          109.00      5.50      1.16            +----------+------------+----------+-----------+-------+ ACA          -37.00                1.15            +----------+------------+----------+-----------+-------+ Term ICA     53.00                 1.39            +----------+------------+----------+-----------+-------+ PCA          53.00                 1.27            +----------+------------+----------+-----------+-------+ Opthalmic    23.00                 1.90            +----------+------------+----------+-----------+-------+ ICA siphon   64.00                 1.26            +----------+------------+----------+-----------+-------+ Vertebral    -32.00                1.05            +----------+------------+----------+-----------+-------+ Distal ICA   21.00                 1.40            +----------+------------+----------+-----------+-------+  +------------+------+-------+             VM cm Comment +------------+------+-------+ Prox Basilar-24.00        +------------+------+-------+ Dist Basilar-23.00         +------------+------+-------+ +----------------------+----+ Right Lindegaard Ratio3.73 +----------------------+----+ +---------------------+----+ Left Lindegaard Ratio5.19 +---------------------+----+  Summary:  mildly Elevated bilateral middle cerebral artery mean flow velocities, left mroe than right, improved from 2 days ago, no significant vasospasm. Normal mean flow velocities in remaining identified vessels of anterior and posterior cerebral circulation. normal flow directions in all vessels. However, diffusely elevated pulsatility indexes indicates increased intracranial pressure. clinial correlation is recommended. *See table(s) above for TCD measurements and observations.  Diagnosing physician: Marvel Plan MD Electronically signed by Marvel Plan MD on 03/30/2022 at 5:35:48 PM.    Final     ROS 10 point review of systems is negative except as listed above in HPI.   Physical Exam Blood pressure 118/61, pulse (!) 58, temperature 98.4 F (36.9 C), temperature source Oral, resp. rate 19, height 5\' 6"  (1.676 m), weight 119.2 kg, SpO2 96 %. Constitutional: well-developed, well-nourished Oropharynx: edematous oropharyngeal mucosa Neck: no thyromegaly, trachea midline, trach in place Chest: absent respiratory effort Abdomen: soft, obese GU: normal female genitalia  Extremities: absent radial and pedal pulses bilaterally    Assessment/Plan: 19F with OP edema. Surgical airway obtained but ROSC unable to be obtained.   , MD General and Trauma Surgery [facility]

## 2022-05-01 NOTE — Code Documentation (Addendum)
Cardiopulmonary Resuscitation Note  Rhonda Delgado  [id number]  Feb 10, 1981  Date:04/21/2022  Time:9:21 AM   Provider Performing:Jansen Sciuto L Dorwin Fitzhenry   Procedure: Cardiopulmonary Resuscitation ([address]50)  Indication(s) Loss of Pulse  Consent N/A  Anesthesia N/A  Time Out N/A  Sterile Technique Hand hygiene, gloves  Procedure Description Called to patient's room for CODE BLUE. Initial rhythm was PEA/Asystole. Patient received high quality chest compressions for ~20 minutes. Epinephrine was administered every 3 minutes as directed by time Biomedical engineer. Additional pharmacologic interventions included sodium bicarbonate. Additional procedural interventions include emergent tracheostomy tube placement. I was at bedside to assist Drs. [name] with emergent airway placement and handling of the bronchoscope for visualization. Return of spontaneous circulation was not achieved.  Family to be notified.  Complications/Tolerance N/A  EBL N/A  Specimen(s) N/A  Josephine Igo, DO [facility] 04/13/2022 9:22 AM

## 2022-05-01 NOTE — Progress Notes (Signed)
Patient's family unsure of which funeral home they will be using. Cala Bradford provided with Patient Placement's phone number to call when ready.

## 2022-05-01 DEATH — deceased

## 2022-08-10 IMAGING — DX DG CHEST 1V PORT
1 series · 1 of 1 positions shown · non-contrast
Comparison: 03/21/2022

CLINICAL DATA: Endotracheal tube placement

EXAM:
PORTABLE CHEST 1 VIEW

[chest]
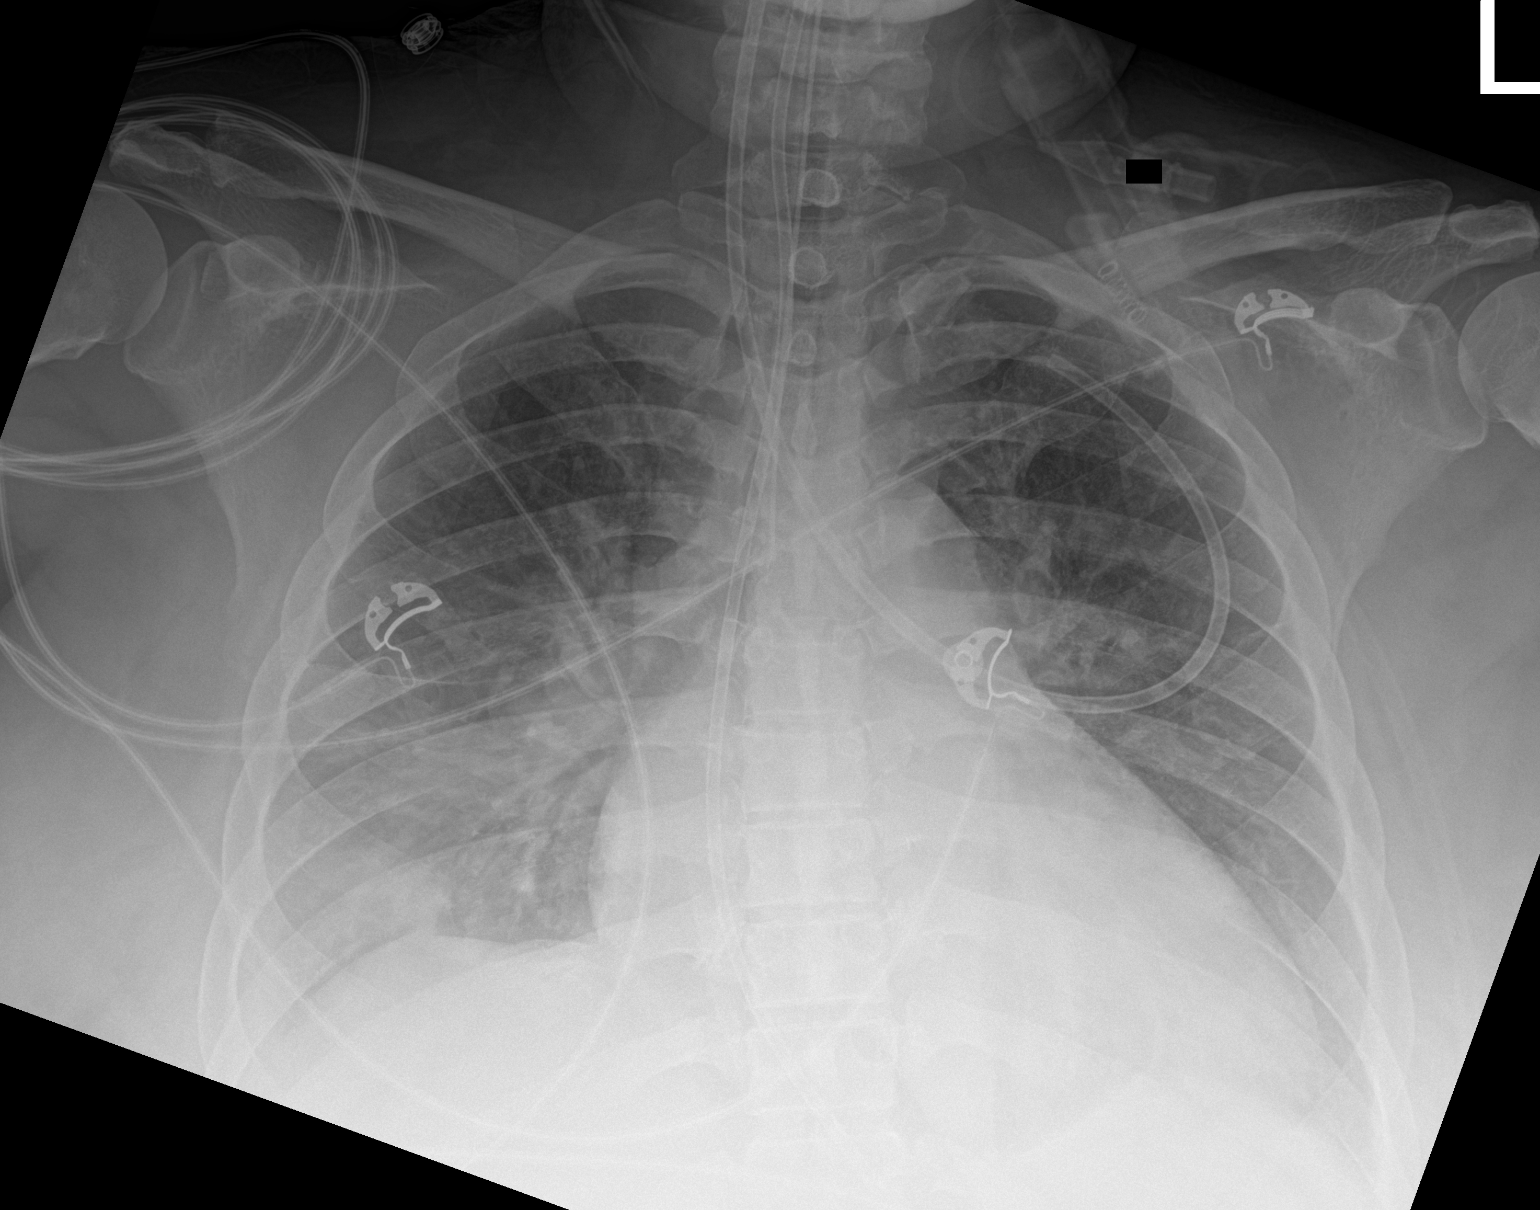

[1 of 1 positions shown; findings below may reference images not displayed]

FINDINGS: Interval placement of endotracheal tube tube, tip positioned right
at the level of the carina. Interval placement of enteric feeding
tube, partially imaged, below the diaphragm. Unchanged cardiomegaly.
Small, layering bilateral pleural effusions and diffuse interstitial
opacity, unchanged.
IMPRESSION: 1. Interval placement of endotracheal tube, tip positioned right at
the level of the carina. Consider slight retraction to the
midtrachea.
2. Interval placement of enteric feeding tube, partially imaged,
below the diaphragm.
3. Unchanged cardiomegaly, small, layering bilateral pleural
effusions and diffuse interstitial opacity.

## 2022-08-12 IMAGING — DX DG CHEST 1V PORT
1 series · 1 of 1 positions shown · non-contrast
Comparison: Chest radiograph 03/22/2022

CLINICAL DATA: Aspiration.

EXAM:
PORTABLE CHEST 1 VIEW

[chest ap]
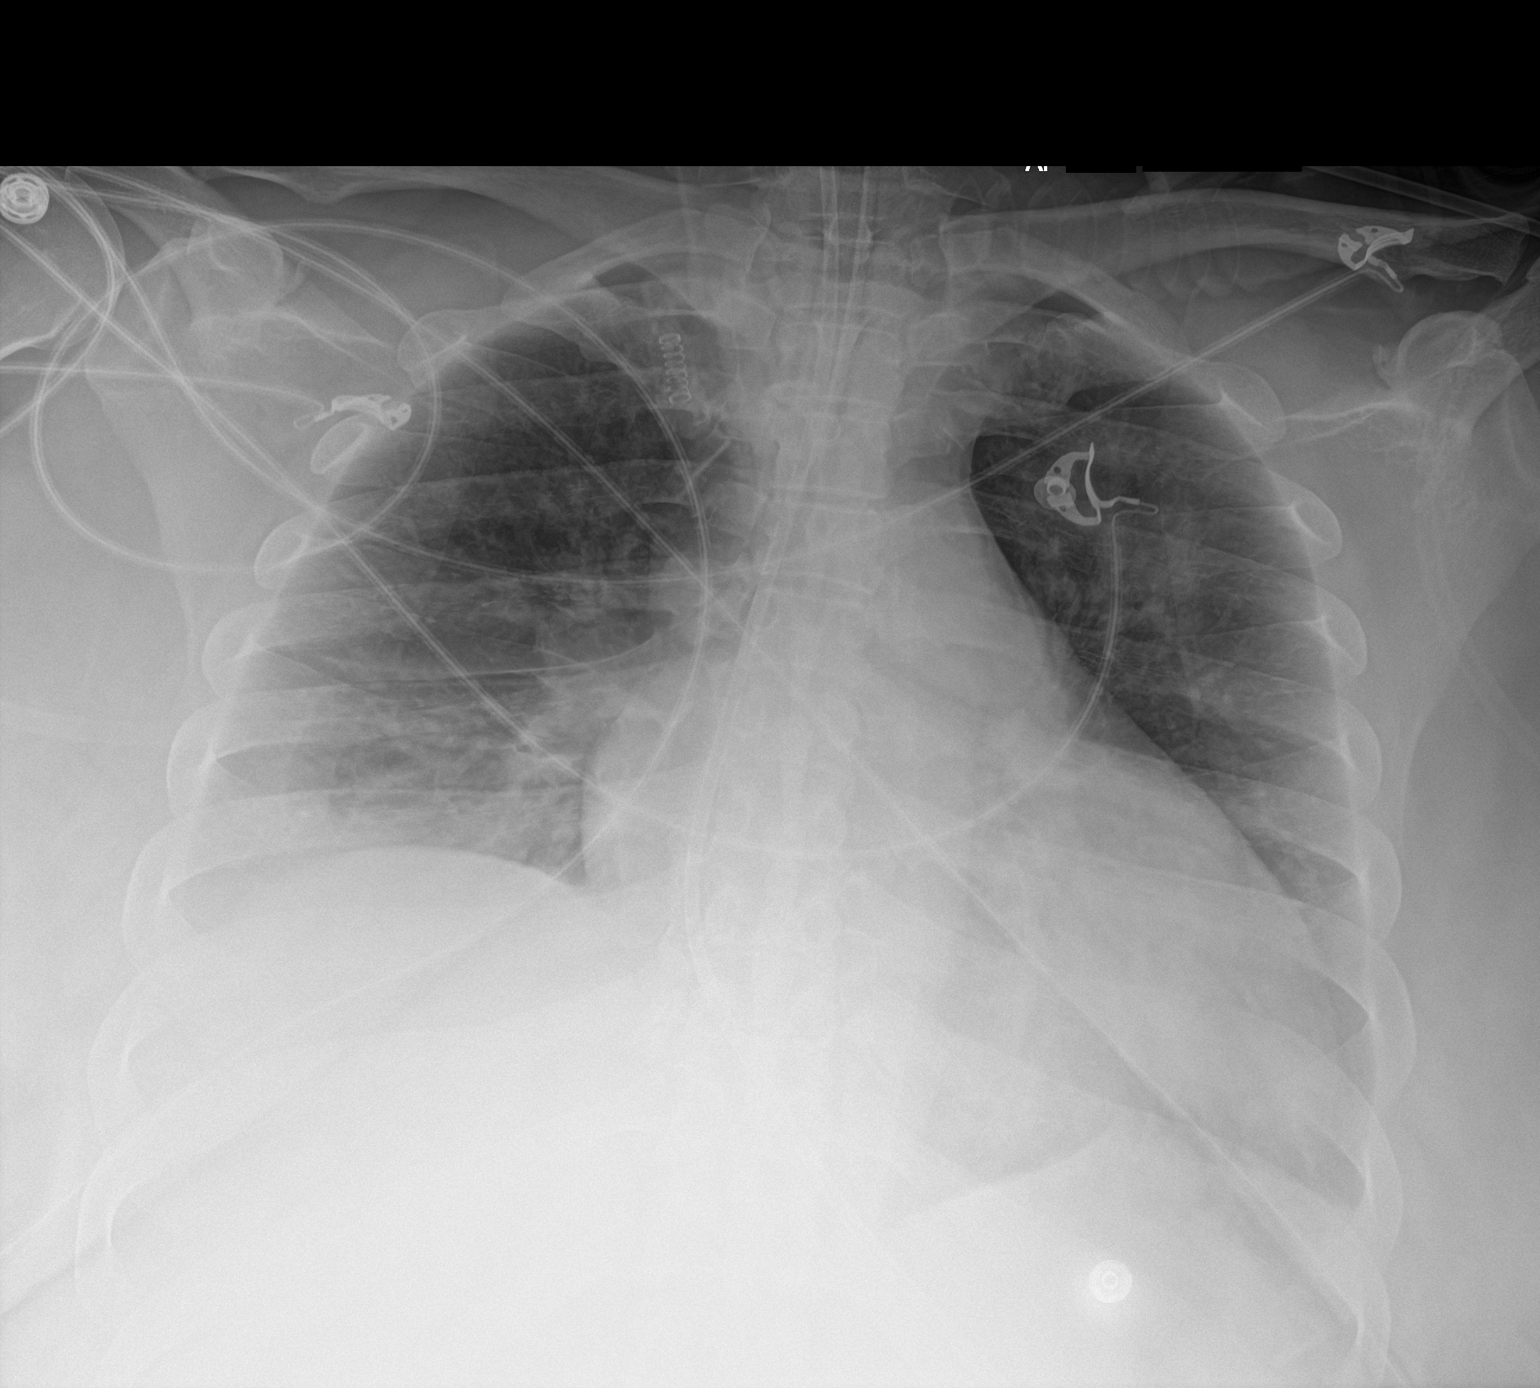

[1 of 1 positions shown; findings below may reference images not displayed]

FINDINGS: Endotracheal tube terminates approximately 4 cm above the carina.
Feeding tube courses into the abdomen with tip not imaged. The
cardiac silhouette remains enlarged. There is mild elevation of the
right hemidiaphragm. Diffuse interstitial opacities have mildly
improved. Small pleural effusions have also likely decreased. No
pneumothorax is identified.
IMPRESSION: Mildly improved appearance of the chest suggesting improved edema.
